# Patient Record
Sex: Male | Born: 1960 | Race: White | Hispanic: No | Marital: Married | Smoking: Never smoker
Health system: Southern US, Community
[De-identification: ages and names within clinical notes are randomized; demographics above are authoritative.]

## PROBLEM LIST (undated history)

## (undated) DIAGNOSIS — Z91018 Allergy to other foods: Secondary | ICD-10-CM

## (undated) DIAGNOSIS — E785 Hyperlipidemia, unspecified: Secondary | ICD-10-CM

## (undated) DIAGNOSIS — I7090 Unspecified atherosclerosis: Secondary | ICD-10-CM

## (undated) DIAGNOSIS — E079 Disorder of thyroid, unspecified: Secondary | ICD-10-CM

## (undated) DIAGNOSIS — E669 Obesity, unspecified: Secondary | ICD-10-CM

## (undated) DIAGNOSIS — I708 Atherosclerosis of other arteries: Secondary | ICD-10-CM

## (undated) HISTORY — DX: Obesity, unspecified: E66.9

## (undated) HISTORY — PX: HERNIA REPAIR: SHX51

## (undated) HISTORY — DX: Hyperlipidemia, unspecified: E78.5

## (undated) HISTORY — PX: CHOLECYSTECTOMY, LAPAROSCOPIC: SHX56

## (undated) HISTORY — DX: Disorder of thyroid, unspecified: E07.9

## (undated) HISTORY — DX: Atherosclerosis of other arteries: I70.8

## (undated) HISTORY — DX: Allergy to other foods: Z91.018

## (undated) HISTORY — PX: CORNEAL TRANSPLANT: SHX108

## (undated) HISTORY — DX: Unspecified atherosclerosis: I70.90

---

## 2009-10-24 ENCOUNTER — Emergency Department (HOSPITAL_COMMUNITY): Admission: EM | Admit: 2009-10-24 | Discharge: 2009-10-24 | Payer: Self-pay | Admitting: Family Medicine

## 2011-03-16 ENCOUNTER — Inpatient Hospital Stay (INDEPENDENT_AMBULATORY_CARE_PROVIDER_SITE_OTHER)
Admission: RE | Admit: 2011-03-16 | Discharge: 2011-03-16 | Disposition: A | Discharge status: Home / Self Care | Payer: 59 | Source: Ambulatory Visit | Attending: Emergency Medicine | Admitting: Emergency Medicine

## 2011-03-16 ENCOUNTER — Emergency Department (HOSPITAL_COMMUNITY)
Admission: EM | Admit: 2011-03-16 | Discharge: 2011-03-16 | Discharge status: Against Medical Advice | Payer: 59 | Attending: Emergency Medicine | Admitting: Emergency Medicine

## 2011-03-16 DIAGNOSIS — R42 Dizziness and giddiness: Secondary | ICD-10-CM

## 2011-03-16 DIAGNOSIS — Z0389 Encounter for observation for other suspected diseases and conditions ruled out: Secondary | ICD-10-CM | POA: Insufficient documentation

## 2011-03-16 DIAGNOSIS — R002 Palpitations: Secondary | ICD-10-CM

## 2011-08-31 LAB — HM COLONOSCOPY

## 2013-03-11 ENCOUNTER — Other Ambulatory Visit: Payer: Self-pay | Admitting: Physician Assistant

## 2013-03-12 NOTE — Telephone Encounter (Signed)
Pt has not been seen since 2012.  But has appt in July.  One month refill of thyroid med sent to pharmacy

## 2013-03-24 ENCOUNTER — Other Ambulatory Visit: Payer: Managed Care, Other (non HMO)

## 2013-03-24 ENCOUNTER — Other Ambulatory Visit: Payer: Self-pay | Admitting: Family Medicine

## 2013-03-24 ENCOUNTER — Encounter: Payer: Self-pay | Admitting: Family Medicine

## 2013-03-24 ENCOUNTER — Ambulatory Visit (INDEPENDENT_AMBULATORY_CARE_PROVIDER_SITE_OTHER): Payer: Managed Care, Other (non HMO) | Admitting: Family Medicine

## 2013-03-24 VITALS — BP 100/66 | HR 80 | Temp 98.3°F | Resp 18 | Ht 70.5 in | Wt 268.0 lb

## 2013-03-24 DIAGNOSIS — E785 Hyperlipidemia, unspecified: Secondary | ICD-10-CM

## 2013-03-24 DIAGNOSIS — E039 Hypothyroidism, unspecified: Secondary | ICD-10-CM

## 2013-03-24 DIAGNOSIS — Z79899 Other long term (current) drug therapy: Secondary | ICD-10-CM

## 2013-03-24 DIAGNOSIS — E669 Obesity, unspecified: Secondary | ICD-10-CM

## 2013-03-24 DIAGNOSIS — Z125 Encounter for screening for malignant neoplasm of prostate: Secondary | ICD-10-CM

## 2013-03-24 LAB — COMPREHENSIVE METABOLIC PANEL
ALT: 28 U/L (ref 0–53)
AST: 24 U/L (ref 0–37)
CO2: 26 mEq/L (ref 19–32)
Calcium: 9.3 mg/dL (ref 8.4–10.5)
Chloride: 104 mEq/L (ref 96–112)
Creat: 1.01 mg/dL (ref 0.50–1.35)
Potassium: 4.7 mEq/L (ref 3.5–5.3)
Sodium: 139 mEq/L (ref 135–145)
Total Protein: 6.9 g/dL (ref 6.0–8.3)

## 2013-03-24 LAB — LIPID PANEL
Cholesterol: 178 mg/dL (ref 0–200)
Total CHOL/HDL Ratio: 4.2 Ratio

## 2013-03-24 LAB — PSA: PSA: 0.5 ng/mL (ref <=4.00)

## 2013-03-24 LAB — TSH: TSH: 0.045 u[IU]/mL — ABNORMAL LOW (ref 0.350–4.500)

## 2013-03-24 MED ORDER — ORLISTAT 120 MG PO CAPS: 120.0000 mg | ORAL_CAPSULE | Freq: Three times a day (TID) | ORAL | Status: DC

## 2013-03-24 NOTE — Progress Notes (Signed)
  Subjective:    Patient ID: Juan Stone, male    DOB: Dec 03, 1960, 52 y.o.   MRN: [id number]  HPI Patient has hypothyroidism. He is currently taking levothyroxine 200 mcg by mouth daily. He denies any depression, weight changes, alopecia, palpitations, or tremor.  He also has hyperlipidemia. Currently taking simvastatin 20 mg by mouth daily. He denies any myalgias or right upper quadrant pain.  He also has obesity.  He is interested in weight loss and medical options for weight loss.  His colonoscopy was in 2012 and showed only a tubular adenoma. It is not due again until 2017. He is overdue for prostate exam.  Past Medical History  Diagnosis Date  . Hyperlipidemia   . Thyroid disease     hypothyroidism  . Obesity    Current Outpatient Prescriptions on File Prior to Visit  Medication Sig Dispense Refill  . levothyroxine (SYNTHROID, LEVOTHROID) 200 MCG tablet TAKE 1 TABLET BY MOUTH EVERY DAY  30 tablet  0   No current facility-administered medications on file prior to visit.   No Known Allergies History   Social History  . Marital Status: Married    Spouse Name: N/A    Number of Children: N/A  . Years of Education: N/A   Occupational History  . Not on file.   Social History Main Topics  . Smoking status: Never Smoker   . Smokeless tobacco: Not on file  . Alcohol Use: No     Comment: Ocassional  . Drug Use: No  . Sexually Active: Not on file   Other Topics Concern  . Not on file   Social History Narrative  . No narrative on file   Family History  Problem Relation Age of Onset  . COPD Mother   . Hyperlipidemia Father   . Heart disease Father       Review of Systems  All other systems reviewed and are negative.       Objective:   Physical Exam  Vitals reviewed. Constitutional: He appears well-developed and well-nourished.  HENT:  Mouth/Throat: Oropharynx is clear and moist.  Neck: Neck supple. No JVD present. No thyromegaly present.   Cardiovascular: Normal rate, regular rhythm and normal heart sounds.  Exam reveals no gallop and no friction rub.   No murmur heard. Pulmonary/Chest: Effort normal and breath sounds normal. No respiratory distress. He has no wheezes. He has no rales. He exhibits no tenderness.  Abdominal: Soft. Bowel sounds are normal. He exhibits no distension. There is no tenderness. There is no rebound and no guarding.  Genitourinary: Rectum normal and prostate normal.  Lymphadenopathy:    He has no cervical adenopathy.  Skin: Skin is warm and dry. No rash noted. No erythema. No pallor.          Assessment & Plan:  1. Unspecified hypothyroidism Check the patient's TSH and titrate his levothyroxine to achieve a TSH within goal.  2. Obesity, unspecified We discussed therapeutic lifestyle changes including a 1500-calorie a day diet, 30 minutes of aerobic exercise 3-4 days a week, and medications for weight loss. The patient elects to try orlistat 120 mg by mouth 3 times a day  3. HLD (hyperlipidemia) Check fasting lipid panel. Goal LDL is less than 130  4. Prostate cancer screening Prostate exam is normal I will check a PSA.

## 2013-03-27 ENCOUNTER — Encounter: Payer: Self-pay | Admitting: Family Medicine

## 2013-03-27 ENCOUNTER — Other Ambulatory Visit: Payer: Self-pay | Admitting: Family Medicine

## 2013-03-27 ENCOUNTER — Telehealth: Payer: Self-pay | Admitting: Family Medicine

## 2013-03-27 DIAGNOSIS — E039 Hypothyroidism, unspecified: Secondary | ICD-10-CM

## 2013-03-27 MED ORDER — LEVOTHYROXINE SODIUM 88 MCG PO TABS: 88.0000 ug | ORAL_TABLET | Freq: Every day | ORAL | Status: DC

## 2013-03-27 MED ORDER — LEVOTHYROXINE SODIUM 100 MCG PO TABS: 100.0000 ug | ORAL_TABLET | Freq: Every day | ORAL | Status: DC

## 2013-03-27 NOTE — Telephone Encounter (Signed)
Med filled.  

## 2013-03-27 NOTE — Progress Notes (Signed)
Tried to call pt x 3 phone keeps hanging up on 03/25/13 and again today..will send letter to pt to contact office about labs

## 2013-03-27 NOTE — Telephone Encounter (Signed)
He can try 175 but we need to recheck tsh in 6 weeks.

## 2013-03-28 MED ORDER — LEVOTHYROXINE SODIUM 175 MCG PO TABS: 175.0000 ug | ORAL_TABLET | Freq: Every day | ORAL | Status: DC

## 2013-03-28 MED ORDER — SIMVASTATIN 20 MG PO TABS: 20.0000 mg | ORAL_TABLET | Freq: Every day | ORAL | Status: DC

## 2013-03-28 NOTE — Telephone Encounter (Signed)
thryoid med sent to [facility] and simvastatin sent to [facility] per pt request

## 2013-05-21 ENCOUNTER — Telehealth: Payer: Self-pay | Admitting: Family Medicine

## 2013-05-21 MED ORDER — FLUTICASONE PROPIONATE 50 MCG/ACT NA SUSP: 2.0000 | Freq: Every day | NASAL | Status: DC

## 2013-05-21 NOTE — Telephone Encounter (Signed)
Rx Refilled  

## 2013-05-21 NOTE — Telephone Encounter (Signed)
Fluticasone Prop 50 mcg spray inhale 2 sprays in each nostril every day #48

## 2013-05-22 ENCOUNTER — Other Ambulatory Visit: Payer: Managed Care, Other (non HMO)

## 2013-05-22 DIAGNOSIS — E039 Hypothyroidism, unspecified: Secondary | ICD-10-CM

## 2013-05-22 LAB — TSH: TSH: 0.089 u[IU]/mL — ABNORMAL LOW (ref 0.350–4.500)

## 2013-05-28 ENCOUNTER — Other Ambulatory Visit: Payer: Self-pay | Admitting: Family Medicine

## 2013-05-28 MED ORDER — LEVOTHYROXINE SODIUM 125 MCG PO TABS: 125.0000 ug | ORAL_TABLET | Freq: Every day | ORAL | Status: DC

## 2013-07-24 ENCOUNTER — Other Ambulatory Visit: Payer: Self-pay

## 2013-07-25 ENCOUNTER — Other Ambulatory Visit: Payer: Managed Care, Other (non HMO)

## 2013-07-25 DIAGNOSIS — E039 Hypothyroidism, unspecified: Secondary | ICD-10-CM

## 2013-07-25 DIAGNOSIS — Z79899 Other long term (current) drug therapy: Secondary | ICD-10-CM

## 2013-07-31 ENCOUNTER — Telehealth: Payer: Self-pay | Admitting: *Deleted

## 2013-07-31 NOTE — Telephone Encounter (Signed)
Wants referral to see endocrinologist d/t his TSH level fluctuating

## 2013-08-05 ENCOUNTER — Other Ambulatory Visit: Payer: Self-pay | Admitting: Family Medicine

## 2013-08-05 DIAGNOSIS — E039 Hypothyroidism, unspecified: Secondary | ICD-10-CM

## 2013-08-05 NOTE — Telephone Encounter (Signed)
Ok with endo referral, but I would still continue the plan for now.

## 2013-08-06 NOTE — Telephone Encounter (Signed)
..  Patient aware per vm 

## 2013-08-15 ENCOUNTER — Telehealth: Payer: Self-pay | Admitting: Family Medicine

## 2013-08-15 DIAGNOSIS — E039 Hypothyroidism, unspecified: Secondary | ICD-10-CM

## 2013-08-15 MED ORDER — LEVOTHYROXINE SODIUM 150 MCG PO TABS: 150.0000 ug | ORAL_TABLET | Freq: Every day | ORAL | Status: DC

## 2013-08-15 NOTE — Telephone Encounter (Signed)
Left mess for patient to call back.  New RX sent to pharmacy.  Labs ordered

## 2013-08-15 NOTE — Telephone Encounter (Signed)
Message copied by Donne Anon on Fri Aug 15, 2013 12:06 PM ------      Message from: Lynnea Ferrier      Created: Mon Jul 28, 2013  7:20 AM       Increase levothyroxine to 150 mcg poqday and recheck tsh in 2 months. ------

## 2013-08-15 NOTE — Telephone Encounter (Signed)
Spoke to patient about Thyroid blood work and need to change medication.  He said that he had already spoken to someone a few weeks ago and had requested referral to endocrinologist.  On review of Ref Q appt pending with Endocrine.  Gave patient phone # to endocrine office

## 2013-08-15 NOTE — Telephone Encounter (Signed)
Message copied by Donne Anon on Fri Aug 15, 2013  3:23 PM ------      Message from: Lynnea Ferrier      Created: Mon Jul 28, 2013  7:20 AM       Increase levothyroxine to 150 mcg poqday and recheck tsh in 2 months. ------

## 2013-08-26 ENCOUNTER — Ambulatory Visit: Payer: 59 | Admitting: Endocrinology

## 2013-08-28 ENCOUNTER — Encounter: Payer: Self-pay | Admitting: Endocrinology

## 2013-08-28 ENCOUNTER — Ambulatory Visit (INDEPENDENT_AMBULATORY_CARE_PROVIDER_SITE_OTHER): Payer: Managed Care, Other (non HMO) | Admitting: Endocrinology

## 2013-08-28 VITALS — BP 126/80 | HR 68 | Temp 98.1°F | Ht 70.5 in | Wt 274.0 lb

## 2013-08-28 DIAGNOSIS — E039 Hypothyroidism, unspecified: Secondary | ICD-10-CM | POA: Insufficient documentation

## 2013-08-28 NOTE — Patient Instructions (Addendum)
Please repeat the thyroid blood test in 1 month.  We'll contact you with results.       Levothyroxine oral capsules What is this medicine? LEVOTHYROXINE (lee voe thye ROX een) is a thyroid hormone. This medicine can improve symptoms of thyroid deficiency such as slow speech, lack of energy, weight gain, hair loss, dry skin, and feeling cold. It also helps to treat goiter (an enlarged thyroid gland). It is also used to treat some kinds of thyroid cancer along with surgery and other medicines. This medicine may be used for other purposes; ask your health care provider or pharmacist if you have questions. COMMON BRAND NAME(S): Tirosint What should I tell my health care provider before I take this medicine? They need to know if you have any of these conditions: -angina -blood clotting problems -diabetes -dieting or on a weight loss program -fertility problems -heart disease -high levels of thyroid hormone -pituitary gland problem -previous heart attack -an unusual or allergic reaction to levothyroxine, thyroid hormones, other medicines, foods, dyes, or preservatives -pregnant or trying to get pregnant -breast-feeding How should I use this medicine? Take this medicine by mouth with a glass of water. It is best to take on an empty stomach, at least 30 minutes to one hour before breakfast. Avoid taking antacids containing aluminum or magnesium, simethicone, bile acid sequestrants, calcium carbonate, sodium polystyrene sulfonate, ferrous sulfate, and sucralfate within 4 hours of taking this medicine. Do not cut, crush or chew this medicine. Follow the directions on the prescription label. Take at the same time each day. Do not take your medicine more often than directed. Talk to your pediatrician regarding the use of this medicine in children. While this drug may be prescribed for selected conditions, precautions do apply. Since the capsules cannot be crushed or placed in water, they may only be  given to infants and children who are able to swallow an intact capsule. Overdosage: If you think you've taken too much of this medicine contact a poison control center or emergency room at once. Overdosage: If you think you have taken too much of this medicine contact a poison control center or emergency room at once. NOTE: This medicine is only for you. Do not share this medicine with others. What if I miss a dose? If you miss a dose, take it as soon as you can. If it is almost time for your next dose, take only that dose. Do not take double or extra doses. What may interact with this medicine? -amiodarone -antacids -anti-thyroid medicines -calcium supplements -carbamazepine -cholestyramine -colestipol -digoxin -male hormones, including contraceptive or birth control pills -iron supplements -ketamine -liquid nutrition products like Ensure -medicines for colds and breathing difficulties -medicines for diabetes -medicines for mental depression -medicines or herbals used to decrease weight or appetite -phenobarbital or other barbiturate medications -phenytoin -prednisone or other corticosteroids -rifabutin -rifampin -simethicone -sodium polystyrene sulfonate -soy isoflavones -sucralfate -theophylline -warfarin This list may not describe all possible interactions. Give your health care provider a list of all the medicines, herbs, non-prescription drugs, or dietary supplements you use. Also tell them if you smoke, drink alcohol, or use illegal drugs. Some items may interact with your medicine. What should I watch for while using this medicine? Do not switch brands of this medicine unless your health care professional agrees with the change. Ask questions if you are uncertain. You will need regular exams and occasional blood tests to check the response to treatment. If you are receiving this medicine for an underactive  thyroid, it may be several weeks before you notice an  improvement. Check with your doctor or health care professional if your symptoms do not improve. It may be necessary for you to take this medicine for the rest of your life. Do not stop using this medicine unless your doctor or health care professional advises you to. This medicine can affect blood sugar levels. If you have diabetes, check your blood sugar as directed. You may lose some of your hair when you first start treatment. With time, this usually corrects itself. If you are going to have surgery, tell your doctor or health care professional that you are taking this medicine. What side effects may I notice from receiving this medicine? Side effects that you should report to your doctor or health care professional as soon as possible: -allergic reactions like skin rash, itching or hives, swelling of the face, lips, or tongue -chest pain -excessive sweating or intolerance to heat -fast or irregular heartbeat -nervousness -swelling of ankles, feet, or legs -tremors Side effects that usually do not require medical attention (Report these to your doctor or health care professional if they continue or are bothersome.): -changes in appetite -changes in menstrual periods -diarrhea -hair loss -headache -trouble sleeping -weight loss This list may not describe all possible side effects. Call your doctor for medical advice about side effects. You may report side effects to FDA at 1-800-FDA-1088. Where should I keep my medicine? Keep out of the reach of children. Store at room temperature between 15 and 30 degrees C (59 and 86 degrees F). Protect from light and moisture. Keep container tightly closed. Throw away any unused medicine after the expiration date. NOTE: This sheet is a summary. It may not cover all possible information. If you have questions about this medicine, talk to your doctor, pharmacist, or health care provider.  2014, Elsevier/Gold Standard. (2008-11-26 15:25:58)

## 2013-08-28 NOTE — Progress Notes (Signed)
Subjective:    Patient ID: Juan Stone, male    DOB: July 21, 1961, 52 y.o.   MRN: [id number]  HPI Pt was dx'ed with chronic hypothyroidism, in 1999.  He has been on synthroid since then.  In 2014, synthroid dosage need has varied from 125 to 200 mcg/day.  He has minimal if any tremor of the hands, or assoc anxiety.  He says he does not any products such as metamucil, which cold interfere with the absorption of the synthroid.  He has been on the current 150 mcg/day, x approx 10 days.   Past Medical History  Diagnosis Date  . Hyperlipidemia   . Thyroid disease     hypothyroidism  . Obesity     No past surgical history on file.  History   Social History  . Marital Status: Married    Spouse Name: N/A    Number of Children: N/A  . Years of Education: N/A   Occupational History  . Not on file.   Social History Main Topics  . Smoking status: Never Smoker   . Smokeless tobacco: Not on file  . Alcohol Use: No     Comment: Ocassional  . Drug Use: No  . Sexual Activity: Not on file   Other Topics Concern  . Not on file   Social History Narrative  . No narrative on file    Current Outpatient Prescriptions on File Prior to Visit  Medication Sig Dispense Refill  . fluticasone (FLONASE) 50 MCG/ACT nasal spray Place 2 sprays into the nose daily.  16 g  5  . levothyroxine (SYNTHROID, LEVOTHROID) 150 MCG tablet Take 1 tablet (150 mcg total) by mouth daily.  90 tablet  0  . orlistat (XENICAL) 120 MG capsule Take 1 capsule (120 mg total) by mouth 3 (three) times daily with meals.  90 capsule  3  . simvastatin (ZOCOR) 20 MG tablet Take 1 tablet (20 mg total) by mouth at bedtime.  90 tablet  1   No current facility-administered medications on file prior to visit.    No Known Allergies  Family History  Problem Relation Age of Onset  . COPD Mother   . Hyperlipidemia Father   . Heart disease Father   mother had hypothyroidism  BP 126/80  Pulse 68  Temp(Src) 98.1 F (36.7 C)  (Oral)  Ht 5' 10.5" (1.791 m)  Wt 274 lb (124.286 kg)  BMI 38.75 kg/m2  SpO2 98%  Review of Systems denies depression, sob, memory loss, constipation, numbness, dry skin, rhinorrhea, diarrhea, brbpr, easy bruising, and syncope.  He has intermittent muscle cramps, fatigue, arthralgias, and weight gain.  He attributes blurry vision to fuch's dz.    Objective:   Physical Exam VS: see vs page GEN: no distress HEAD: head: no deformity eyes: no periorbital swelling, no proptosis external nose and ears are normal mouth: no lesion seen NECK: supple, thyroid is not enlarged CHEST WALL: no deformity LUNGS: clear to auscultation BREASTS:  No gynecomastia CV: reg rate and rhythm, no murmur ABD: abdomen is soft, nontender.  no hepatosplenomegaly.  not distended.  no hernia MUSCULOSKELETAL: muscle bulk and strength are grossly normal.  no obvious joint swelling.  gait is normal and steady EXTEMITIES: no deformity. No leg ulcer.  1+ bilat leg edema PULSES: dorsalis pedis intact bilat.  no carotid bruit NEURO:  cn 2-12 grossly intact.   readily moves all 4's.  sensation is intact to touch on all 4's SKIN:  Normal texture and temperature.  No rash or suspicious lesion is visible.   NODES:  None palpable at the neck PSYCH: alert, well-oriented.  Does not appear anxious nor depressed.  Lab Results  Component Value Date   TSH 14.789* 07/25/2013      Assessment & Plan:  Chronic primary hypothyroidism: the reason for his fluctuating requirement is unclear.  We discussed how to take synthroid, but he seems to already being taking it correctly.   Weight gain: unlikely thyroid-related. Muscle cramps: not thyroid-related

## 2013-09-26 ENCOUNTER — Other Ambulatory Visit: Payer: Self-pay | Admitting: Endocrinology

## 2013-09-26 ENCOUNTER — Telehealth: Payer: Self-pay

## 2013-09-26 DIAGNOSIS — E039 Hypothyroidism, unspecified: Secondary | ICD-10-CM

## 2013-09-26 NOTE — Telephone Encounter (Signed)
TSH labs for [facility].

## 2013-09-27 LAB — TSH: TSH: 2.418 u[IU]/mL (ref 0.350–4.500)

## 2013-10-03 ENCOUNTER — Other Ambulatory Visit: Payer: 59

## 2013-10-10 DIAGNOSIS — H18519 Endothelial corneal dystrophy, unspecified eye: Secondary | ICD-10-CM | POA: Insufficient documentation

## 2013-10-10 DIAGNOSIS — H2510 Age-related nuclear cataract, unspecified eye: Secondary | ICD-10-CM | POA: Insufficient documentation

## 2013-10-27 ENCOUNTER — Telehealth: Payer: Self-pay | Admitting: Family Medicine

## 2013-10-27 MED ORDER — SIMVASTATIN 20 MG PO TABS: 20.0000 mg | ORAL_TABLET | Freq: Every day | ORAL | Status: DC

## 2013-10-27 MED ORDER — LEVOTHYROXINE SODIUM 150 MCG PO TABS: 150.0000 ug | ORAL_TABLET | Freq: Every day | ORAL | Status: DC

## 2013-10-27 NOTE — Telephone Encounter (Signed)
Call back number is [phone] Pharmacy [facility]  PT is needing a refill on his  simvastatin (ZOCOR) 20 MG tablet levothyroxine (SYNTHROID, LEVOTHROID) 150 MCG tablet  Both needs to be filled for 90 days

## 2013-10-27 NOTE — Telephone Encounter (Signed)
Rx Refilled  

## 2014-01-16 DIAGNOSIS — Z947 Corneal transplant status: Secondary | ICD-10-CM | POA: Insufficient documentation

## 2014-01-19 DIAGNOSIS — Z961 Presence of intraocular lens: Secondary | ICD-10-CM | POA: Insufficient documentation

## 2014-01-21 ENCOUNTER — Other Ambulatory Visit: Payer: Self-pay | Admitting: Family Medicine

## 2014-01-21 NOTE — Telephone Encounter (Signed)
Refill appropriate and filled per protocol. 

## 2014-01-30 ENCOUNTER — Telehealth: Payer: Self-pay | Admitting: Family Medicine

## 2014-01-30 NOTE — Telephone Encounter (Signed)
PATIENT IS  CALLING TO SAY IT LOOKS LIKE THERE IS A PROBLEM FROM OUR SIDE WITH HIS Simvastatin (Tab) PLEASE CALL HIM AT [phone]

## 2014-01-30 NOTE — Telephone Encounter (Signed)
[facility] to schedule appt for ov and labs - will refill medication if appt made

## 2014-02-02 ENCOUNTER — Other Ambulatory Visit: Payer: Managed Care, Other (non HMO)

## 2014-02-02 DIAGNOSIS — E785 Hyperlipidemia, unspecified: Secondary | ICD-10-CM

## 2014-02-02 DIAGNOSIS — E039 Hypothyroidism, unspecified: Secondary | ICD-10-CM

## 2014-02-02 DIAGNOSIS — Z79899 Other long term (current) drug therapy: Secondary | ICD-10-CM

## 2014-02-02 LAB — COMPREHENSIVE METABOLIC PANEL
ALT: 26 U/L (ref 0–53)
AST: 22 U/L (ref 0–37)
Albumin: 4.3 g/dL (ref 3.5–5.2)
Alkaline Phosphatase: 57 U/L (ref 39–117)
BUN: 14 mg/dL (ref 6–23)
CO2: 27 mEq/L (ref 19–32)
Calcium: 9.5 mg/dL (ref 8.4–10.5)
Chloride: 103 mEq/L (ref 96–112)
Creat: 1 mg/dL (ref 0.50–1.35)
Glucose, Bld: 95 mg/dL (ref 70–99)
Potassium: 5.1 mEq/L (ref 3.5–5.3)
Sodium: 138 mEq/L (ref 135–145)
Total Bilirubin: 0.5 mg/dL (ref 0.2–1.2)
Total Protein: 6.7 g/dL (ref 6.0–8.3)

## 2014-02-02 LAB — LIPID PANEL
Cholesterol: 164 mg/dL (ref 0–200)
HDL: 43 mg/dL (ref >39)
LDL CALC: 92 mg/dL (ref 0–99)
TRIGLYCERIDES: 145 mg/dL (ref <150)
Total CHOL/HDL Ratio: 3.8 Ratio
VLDL: 29 mg/dL (ref 0–40)

## 2014-02-02 LAB — CBC WITH DIFFERENTIAL/PLATELET
Basophils Absolute: 0.1 10*3/uL (ref 0.0–0.1)
Basophils Relative: 1 % (ref 0–1)
Eosinophils Absolute: 0.1 10*3/uL (ref 0.0–0.7)
Eosinophils Relative: 2 % (ref 0–5)
HCT: 41.6 % (ref 39.0–52.0)
Hemoglobin: 14.3 g/dL (ref 13.0–17.0)
Lymphocytes Relative: 28 % (ref 12–46)
Lymphs Abs: 1.5 10*3/uL (ref 0.7–4.0)
MCH: 30.2 pg (ref 26.0–34.0)
MCHC: 34.4 g/dL (ref 30.0–36.0)
MCV: 87.9 fL (ref 78.0–100.0)
Monocytes Absolute: 0.5 10*3/uL (ref 0.1–1.0)
Monocytes Relative: 10 % (ref 3–12)
Neutro Abs: 3.1 10*3/uL (ref 1.7–7.7)
Neutrophils Relative %: 59 % (ref 43–77)
Platelets: 249 10*3/uL (ref 150–400)
RBC: 4.73 MIL/uL (ref 4.22–5.81)
RDW: 13.2 % (ref 11.5–15.5)
WBC: 5.2 10*3/uL (ref 4.0–10.5)

## 2014-02-02 LAB — TSH: TSH: 2.546 u[IU]/mL (ref 0.350–4.500)

## 2014-02-02 MED ORDER — SIMVASTATIN 20 MG PO TABS: 20.0000 mg | ORAL_TABLET | Freq: Every day | ORAL | Status: DC

## 2014-02-03 NOTE — Telephone Encounter (Signed)
Appt has been made [facility]

## 2014-02-06 ENCOUNTER — Ambulatory Visit (INDEPENDENT_AMBULATORY_CARE_PROVIDER_SITE_OTHER): Payer: Managed Care, Other (non HMO) | Admitting: Family Medicine

## 2014-02-06 ENCOUNTER — Encounter: Payer: Self-pay | Admitting: Family Medicine

## 2014-02-06 VITALS — BP 110/80 | HR 72 | Temp 97.0°F | Resp 18 | Ht 70.5 in | Wt 270.0 lb

## 2014-02-06 DIAGNOSIS — E785 Hyperlipidemia, unspecified: Secondary | ICD-10-CM

## 2014-02-06 DIAGNOSIS — E039 Hypothyroidism, unspecified: Secondary | ICD-10-CM

## 2014-02-06 MED ORDER — SIMVASTATIN 20 MG PO TABS: 20.0000 mg | ORAL_TABLET | Freq: Every day | ORAL | Status: DC

## 2014-02-06 MED ORDER — LEVOTHYROXINE SODIUM 150 MCG PO TABS: ORAL_TABLET | ORAL | Status: DC

## 2014-02-06 MED ORDER — FLUTICASONE PROPIONATE 50 MCG/ACT NA SUSP: 2.0000 | Freq: Every day | NASAL | Status: DC

## 2014-02-06 NOTE — Progress Notes (Signed)
   Subjective:    Patient ID: Juan Stone, male    DOB: 07/15/1961, 53 y.o.   MRN: [id number]  HPI  Patient has hyperlipidemia and hypothyroidism. He is currently on levothyroxin 150 mcg by mouth daily and simvastatin 40 mg by mouth daily. He denies any myalgias or right upper quadrant pain. He does still complain of fatigue although his TSH is within therapeutic limits. He is scheduled to be evaluated for obstructive sleep apnea. He is never had his testosterone level checked. He is not engaging in any regular aerobic exercise nor is he following a low calorie diet. Past Medical History  Diagnosis Date  . Hyperlipidemia   . Thyroid disease     hypothyroidism  . Obesity    No current outpatient prescriptions on file prior to visit.   No current facility-administered medications on file prior to visit.   No Known Allergies History   Social History  . Marital Status: Married    Spouse Name: N/A    Number of Children: N/A  . Years of Education: N/A   Occupational History  . Not on file.   Social History Main Topics  . Smoking status: Never Smoker   . Smokeless tobacco: Not on file  . Alcohol Use: No     Comment: Ocassional  . Drug Use: No  . Sexual Activity: Not on file   Other Topics Concern  . Not on file   Social History Narrative  . No narrative on file      Review of Systems  All other systems reviewed and are negative.      Objective:   Physical Exam  Vitals reviewed. Neck: No thyromegaly present.  Cardiovascular: Normal rate, regular rhythm and normal heart sounds.   Pulmonary/Chest: Effort normal and breath sounds normal. No respiratory distress. He has no wheezes. He has no rales.  Abdominal: Soft. Bowel sounds are normal. He exhibits no distension. There is no tenderness. There is no rebound.  Musculoskeletal: He exhibits no edema.  Lymphadenopathy:    He has no cervical adenopathy.          Assessment & Plan:  1. Unspecified  hypothyroidism TSH is within therapeutic limits. I'll make no changes to his levothyroxine.  2. Other and unspecified hyperlipidemia Continue simvastatin 20 mg by mouth daily.  I encouraged the patient to engage in 30 minutes of aerobic exercise 5 days a week and to consume 1500 calories per day total in order to achieve weight loss. I would like to have the patient weigh less than 200 lbs.

## 2014-02-20 ENCOUNTER — Encounter: Payer: Self-pay | Admitting: Family Medicine

## 2014-03-03 ENCOUNTER — Encounter: Payer: Self-pay | Admitting: Family Medicine

## 2014-04-06 ENCOUNTER — Ambulatory Visit (INDEPENDENT_AMBULATORY_CARE_PROVIDER_SITE_OTHER): Payer: Managed Care, Other (non HMO) | Admitting: Physician Assistant

## 2014-04-06 ENCOUNTER — Encounter: Payer: Self-pay | Admitting: Physician Assistant

## 2014-04-06 VITALS — BP 126/76 | HR 60 | Temp 98.9°F | Resp 18 | Wt 279.0 lb

## 2014-04-06 DIAGNOSIS — G5790 Unspecified mononeuropathy of unspecified lower limb: Secondary | ICD-10-CM

## 2014-04-06 DIAGNOSIS — R209 Unspecified disturbances of skin sensation: Secondary | ICD-10-CM

## 2014-04-06 DIAGNOSIS — M792 Neuralgia and neuritis, unspecified: Secondary | ICD-10-CM

## 2014-04-06 DIAGNOSIS — R202 Paresthesia of skin: Secondary | ICD-10-CM

## 2014-04-07 NOTE — Progress Notes (Signed)
Patient ID: Juan HartiganRobert Stone MRN: [id number], DOB: [birth year], 53 y.o. Date of Encounter: @DATE @  Chief Complaint:  Chief Complaint  Patient presents with  . c/o numb feeling in left thigh x 3 mths    now becoming painful cramping pain    HPI: 53 y.o. year old white male  presents with above complaint.   He says he had eye surgery in April. He had to lie on his back for 12-14 hours after the surgery. Says that since then he has been having numb tingly feeling in the anterior aspect of his left thigh. Says 2 weeks ago he had to do a lot of walking. He developed pain in the anterior thigh. Says it is a burning pain. He says that he stretched and that gave him relief for about one hour. Says when that area tightens up/cramps,  it as an 8/10 on the pain scale. Says this happens occasionally. Says the numb tingly sensation is there all the time.  He has noticed no discomfort in his low back more in the buttock area.  Says the numb /  tingly feeling is " like when your arm falls asleep" -- that same type of sensation.  Reports that he has had no trauma or injury and has done no activity to cause this. 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 The rest of his time he is at a desk and computer.   Past Medical History  Diagnosis Date  . Hyperlipidemia   . Thyroid disease     hypothyroidism  . Obesity      Home Meds: Outpatient Prescriptions Prior to Visit  Medication Sig Dispense Refill  . levothyroxine (SYNTHROID, LEVOTHROID) 150 MCG tablet TAKE 1 TABLET DAILY  90 tablet  3  . simvastatin (ZOCOR) 20 MG tablet Take 1 tablet (20 mg total) by mouth at bedtime.  90 tablet  3  . fluticasone (FLONASE) 50 MCG/ACT nasal spray Place 2 sprays into both nostrils daily.  16 g  11   No facility-administered medications prior to visit.    Allergies: No Known Allergies  History   Social History  . Marital Status: Married    Spouse  Name: N/A    Number of Children: N/A  . Years of Education: N/A   Occupational History  . Not on file.   Social History Main Topics  . Smoking status: Never Smoker   . Smokeless tobacco: Never Used  . Alcohol Use: No     Comment: Ocassional  . Drug Use: No  . Sexual Activity: Not on file   Other Topics Concern  . Not on file   Social History Narrative  . No narrative on file    Family History  Problem Relation Age of Onset  . COPD Mother   . Hyperlipidemia Father   . Heart disease Father      Review of Systems:  See HPI for pertinent ROS. All other ROS negative.    Physical Exam: Blood pressure 126/76, pulse 60, temperature 98.9 F (37.2 C), temperature source Oral, resp. rate 18, weight 279 lb (126.554 kg)., Body mass index is 39.45 kg/(m^2). General: WM. Moderate Obesity. Appears in no acute distress. Neck: Supple. No thyromegaly. No lymphadenopathy. Lungs: Clear bilaterally to auscultation without wheezes, rales, or rhonchi. Breathing is unlabored. Heart: RRR with S1 S2. No murmurs, rubs, or gallops. Musculoskeletal:  Strength and tone normal for age. Back; tenderness  with per palpation of the low back. No tenderness with palpation of the sciatic notches bilaterally. Bilateral straight leg raise and hip adduction are normal. Sensory evaluation: Light touch of bilateral anterior thighs: He says he can feel the touch on the left anterior thigh but says it is a little bit duller than what he can feel on the right anterior thigh. Patella reflexes 2+ and equal bilaterally. Extremities/Skin: Warm and dry. No rashes. Neuro: Alert and oriented X 3. Moves all extremities spontaneously. Gait is normal. CNII-XII grossly in tact. Psych:  Responds to questions appropriately with a normal affect.     ASSESSMENT AND PLAN:  53 y.o. year old male with  1. Left leg paresthesias - DG Lumbar Spine Complete; Future - Motor nerve conduction test w/ f-wave study; Future  2.  Neuropathic pain of thigh, left - DG Lumbar Spine Complete; Future - Motor nerve conduction test w/ f-wave study; Future  Will obtain lumbar spine x-ray. If this shows no significant abnormality to explain the symptoms then will get nerve conduction studies. Will follow up with patient after we get these results.  Signed, [address] [place], [place], [facility] 04/07/2014 8:05 AM

## 2014-04-08 ENCOUNTER — Telehealth: Payer: Self-pay | Admitting: Family Medicine

## 2014-04-08 ENCOUNTER — Ambulatory Visit
Admission: RE | Admit: 2014-04-08 | Discharge: 2014-04-08 | Disposition: A | Discharge status: Home / Self Care | Payer: Managed Care, Other (non HMO) | Source: Ambulatory Visit | Attending: Physician Assistant | Admitting: Physician Assistant

## 2014-04-08 DIAGNOSIS — G5790 Unspecified mononeuropathy of unspecified lower limb: Secondary | ICD-10-CM

## 2014-04-08 DIAGNOSIS — G5720 Lesion of femoral nerve, unspecified lower limb: Secondary | ICD-10-CM

## 2014-04-08 DIAGNOSIS — R202 Paresthesia of skin: Secondary | ICD-10-CM

## 2014-04-08 DIAGNOSIS — R937 Abnormal findings on diagnostic imaging of other parts of musculoskeletal system: Secondary | ICD-10-CM

## 2014-04-08 DIAGNOSIS — R209 Unspecified disturbances of skin sensation: Secondary | ICD-10-CM

## 2014-04-08 DIAGNOSIS — M792 Neuralgia and neuritis, unspecified: Secondary | ICD-10-CM

## 2014-04-08 NOTE — Telephone Encounter (Signed)
Message copied by Donne AnonPLUMMER, Ettel Albergo M on Wed Apr 08, 2014  4:01 PM ------      Message from: Allayne ButcherIXON, MARY      Created: Wed Apr 08, 2014 12:18 PM       Pt had recent office visit at which time he was complaining of paresthesias in the left anterior thigh.      At that visit I ordered a lumbar spine x-ray and Nerve conduction studies.      Tell Patient that the x-ray does show some abnormalities in the lumbar spine.      I Think his symptoms very well may be secondary to these abnormalities.      Tell him that we will get an MRI of lumbar spine and hold off on getting nerve conduction studies for now.      Please cancel the order for the nerve conduction studies.      Please place order for MRI lumbar spine. ------

## 2014-04-08 NOTE — Telephone Encounter (Signed)
Pt called with lumbar xray results.  MRI ordered. Nerve conduction studies has been canceled

## 2014-04-14 ENCOUNTER — Ambulatory Visit
Admission: RE | Admit: 2014-04-14 | Discharge: 2014-04-14 | Disposition: A | Discharge status: Home / Self Care | Payer: Managed Care, Other (non HMO) | Source: Ambulatory Visit | Attending: Physician Assistant | Admitting: Physician Assistant

## 2014-04-14 DIAGNOSIS — R937 Abnormal findings on diagnostic imaging of other parts of musculoskeletal system: Secondary | ICD-10-CM

## 2014-04-14 DIAGNOSIS — G5790 Unspecified mononeuropathy of unspecified lower limb: Secondary | ICD-10-CM

## 2014-04-14 DIAGNOSIS — R209 Unspecified disturbances of skin sensation: Secondary | ICD-10-CM

## 2014-04-17 ENCOUNTER — Telehealth: Payer: Self-pay | Admitting: Family Medicine

## 2014-04-17 NOTE — Telephone Encounter (Signed)
Discussed MRI results with patient.  Recommended the two specialist per provider.  Patient wants to look into who he wants to see and then will call us back and let us know who he wants to see.

## 2014-04-17 NOTE — Telephone Encounter (Signed)
Message copied by Donne AnonPLUMMER, Johonna Binette M on Fri Apr 17, 2014  3:14 PM ------      Message from: Allayne ButcherIXON, MARY      Created: Wed Apr 15, 2014  7:38 AM       Tell patient that MRI shows multiple abnormalities including bulging discs.      At his recent office visit, he was complaining of burning pain and numb tingly sensation in his left thigh. Tell him that most likely the symptoms are secondary to these abnormalities of the lumbar spine.      Tell him I recommend he followup with a spine specialist.      Place order for referral--- [facility] or can go to a neurosurgeon or Dr. Noel Geroldohen at the spine specialist. ------

## 2014-05-06 ENCOUNTER — Other Ambulatory Visit: Payer: Self-pay | Admitting: Family Medicine

## 2014-05-06 ENCOUNTER — Other Ambulatory Visit: Payer: Self-pay | Admitting: *Deleted

## 2014-05-06 MED ORDER — LEVOTHYROXINE SODIUM 150 MCG PO TABS: ORAL_TABLET | ORAL | Status: DC

## 2014-05-06 NOTE — Telephone Encounter (Signed)
Received fax requesting refill on Levothyroxine.   Refill appropriate and filled per protocol. 

## 2014-05-06 NOTE — Telephone Encounter (Signed)
Refill appropriate and filled per protocol. 

## 2014-05-07 ENCOUNTER — Other Ambulatory Visit: Payer: Self-pay | Admitting: *Deleted

## 2014-05-07 NOTE — Telephone Encounter (Signed)
Received fax requesting refill on Levothyroxine.   Prescription was sent to pharmacy on 05/06/2014.

## 2014-08-12 DIAGNOSIS — H40051 Ocular hypertension, right eye: Secondary | ICD-10-CM | POA: Insufficient documentation

## 2014-08-12 DIAGNOSIS — H1131 Conjunctival hemorrhage, right eye: Secondary | ICD-10-CM | POA: Insufficient documentation

## 2014-08-27 ENCOUNTER — Other Ambulatory Visit: Payer: Managed Care, Other (non HMO)

## 2014-08-27 DIAGNOSIS — Z79899 Other long term (current) drug therapy: Secondary | ICD-10-CM

## 2014-08-27 DIAGNOSIS — E669 Obesity, unspecified: Secondary | ICD-10-CM

## 2014-08-27 DIAGNOSIS — E785 Hyperlipidemia, unspecified: Secondary | ICD-10-CM

## 2014-08-27 DIAGNOSIS — Z0000 Encounter for general adult medical examination without abnormal findings: Secondary | ICD-10-CM

## 2014-08-27 DIAGNOSIS — I10 Essential (primary) hypertension: Secondary | ICD-10-CM

## 2014-08-27 DIAGNOSIS — E039 Hypothyroidism, unspecified: Secondary | ICD-10-CM

## 2014-08-27 LAB — COMPLETE METABOLIC PANEL WITH GFR
ALK PHOS: 63 U/L (ref 39–117)
ALT: 24 U/L (ref 0–53)
AST: 21 U/L (ref 0–37)
Albumin: 4.4 g/dL (ref 3.5–5.2)
BILIRUBIN TOTAL: 0.6 mg/dL (ref 0.2–1.2)
BUN: 14 mg/dL (ref 6–23)
CO2: 26 meq/L (ref 19–32)
Calcium: 10 mg/dL (ref 8.4–10.5)
Chloride: 102 mEq/L (ref 96–112)
Creat: 1.04 mg/dL (ref 0.50–1.35)
GFR, Est Non African American: 80 mL/min
GLUCOSE: 75 mg/dL (ref 70–99)
Potassium: 5 mEq/L (ref 3.5–5.3)
Sodium: 138 mEq/L (ref 135–145)
Total Protein: 7.1 g/dL (ref 6.0–8.3)

## 2014-08-27 LAB — LIPID PANEL
CHOLESTEROL: 196 mg/dL (ref 0–200)
HDL: 47 mg/dL (ref >39)
LDL Cholesterol: 123 mg/dL — ABNORMAL HIGH (ref 0–99)
TRIGLYCERIDES: 132 mg/dL (ref <150)
Total CHOL/HDL Ratio: 4.2 Ratio
VLDL: 26 mg/dL (ref 0–40)

## 2014-08-27 LAB — CBC WITH DIFFERENTIAL/PLATELET
BASOS PCT: 1 % (ref 0–1)
Basophils Absolute: 0.1 10*3/uL (ref 0.0–0.1)
Eosinophils Absolute: 0.1 10*3/uL (ref 0.0–0.7)
Eosinophils Relative: 2 % (ref 0–5)
HCT: 42.9 % (ref 39.0–52.0)
Hemoglobin: 14.8 g/dL (ref 13.0–17.0)
Lymphocytes Relative: 29 % (ref 12–46)
Lymphs Abs: 1.7 10*3/uL (ref 0.7–4.0)
MCH: 30.6 pg (ref 26.0–34.0)
MCHC: 34.5 g/dL (ref 30.0–36.0)
MCV: 88.6 fL (ref 78.0–100.0)
MONO ABS: 0.6 10*3/uL (ref 0.1–1.0)
MONOS PCT: 10 % (ref 3–12)
MPV: 9.2 fL — ABNORMAL LOW (ref 9.4–12.4)
Neutro Abs: 3.4 10*3/uL (ref 1.7–7.7)
Neutrophils Relative %: 58 % (ref 43–77)
Platelets: 273 10*3/uL (ref 150–400)
RBC: 4.84 MIL/uL (ref 4.22–5.81)
RDW: 13.3 % (ref 11.5–15.5)
WBC: 5.9 10*3/uL (ref 4.0–10.5)

## 2014-08-27 LAB — TSH: TSH: 14.457 u[IU]/mL — AB (ref 0.350–4.500)

## 2014-08-28 ENCOUNTER — Encounter: Payer: Self-pay | Admitting: Family Medicine

## 2014-08-28 ENCOUNTER — Ambulatory Visit (INDEPENDENT_AMBULATORY_CARE_PROVIDER_SITE_OTHER): Payer: Managed Care, Other (non HMO) | Admitting: Family Medicine

## 2014-08-28 VITALS — BP 130/78 | HR 68 | Temp 98.3°F | Resp 18 | Wt 282.0 lb

## 2014-08-28 DIAGNOSIS — Z125 Encounter for screening for malignant neoplasm of prostate: Secondary | ICD-10-CM

## 2014-08-28 DIAGNOSIS — E785 Hyperlipidemia, unspecified: Secondary | ICD-10-CM

## 2014-08-28 DIAGNOSIS — E038 Other specified hypothyroidism: Secondary | ICD-10-CM

## 2014-08-28 NOTE — Progress Notes (Signed)
Subjective:    Patient ID: Juan Stone, male    DOB: [birth year], 53 y.o.   MRN: [id number]  HPI  Patient is here today for follow-up of his hyperlipidemia as well as hypothyroidism. His TSH has risen from 2.5 to over 14. His LDL cholesterol has risen from 90-123. The patient has gained approximately 12 pounds. He admits that he is not exercising. He denies any chest pain shortness of breath or dyspnea on exertion. He admits that he has missed several doses of his thyroid. He would like to recheck a TSH next week to confirm this prior to making any changes in his medication. He became frustrated in the past when we had made numerous changes in his thyroid dosage. His flu shot is up-to-date. He denies any myalgias or right upper quadrant pain on his cholesterol medication. He is due for a PSA. Past Medical History  Diagnosis Date  . Hyperlipidemia   . Thyroid disease     hypothyroidism  . Obesity    Past Surgical History  Procedure Laterality Date  . Corneal transplant      Fuch's dystorphy (bilateral)   Current Outpatient Prescriptions on File Prior to Visit  Medication Sig Dispense Refill  . fluticasone (FLONASE) 50 MCG/ACT nasal spray Place 2 sprays into both nostrils daily. 16 g 11  . levothyroxine (SYNTHROID, LEVOTHROID) 150 MCG tablet TAKE 1 TABLET DAILY 90 tablet 3  . prednisoLONE acetate (PRED FORTE) 1 % ophthalmic suspension Place 1 drop into the right eye 2 (two) times daily.    . simvastatin (ZOCOR) 20 MG tablet TAKE 1 TABLET AT BEDTIME 90 tablet 1  . sodium chloride (MURO 128) 5 % ophthalmic solution Place 1 drop into the left eye 2 (two) times daily.     No current facility-administered medications on file prior to visit.   No Known Allergies History   Social History  . Marital Status: Married    Spouse Name: N/A    Number of Children: N/A  . Years of Education: N/A   Occupational History  . Not on file.   Social History Main Topics  . Smoking status: Never  Smoker   . Smokeless tobacco: Never Used  . Alcohol Use: No     Comment: Ocassional  . Drug Use: No  . Sexual Activity: Not on file   Other Topics Concern  . Not on file   Social History Narrative     Review of Systems  All other systems reviewed and are negative.      Objective:   Physical Exam  Constitutional: He appears well-developed.  HENT:  Right Ear: External ear normal.  Left Ear: External ear normal.  Nose: Nose normal.  Mouth/Throat: Oropharynx is clear and moist. No oropharyngeal exudate.  Neck: Neck supple. No thyromegaly present.  Cardiovascular: Normal rate, regular rhythm and normal heart sounds.  Exam reveals no gallop and no friction rub.   No murmur heard. Pulmonary/Chest: Effort normal and breath sounds normal. No respiratory distress. He has no wheezes. He has no rales.  Abdominal: Soft. Bowel sounds are normal. He exhibits no distension. There is no tenderness. There is no rebound and no guarding.  Musculoskeletal: He exhibits no edema.  Lymphadenopathy:    He has no cervical adenopathy.  Vitals reviewed.         Assessment & Plan:  Other specified hypothyroidism - Plan: TSH  HLD (hyperlipidemia)  Prostate cancer screening - Plan: PSA  Patient's blood pressures acceptable. His LDL cholesterol is  still acceptable although it has risen significantly. I believe this is due to his 12 pound weight gain. I encouraged the patient to try to diet exercise and lose weight. I recommended 30 minutes a day 5 days a week of aerobic exercise. Patient would like to come back next week and recheck a TSH. If his TSH is still elevated, I will switch the patient to levothyroxine  175 g by mouth daily. I would recheck a TSH in 2 months. I will also screen a PSA when he returns

## 2014-09-03 ENCOUNTER — Other Ambulatory Visit: Payer: Managed Care, Other (non HMO)

## 2014-09-03 DIAGNOSIS — Z125 Encounter for screening for malignant neoplasm of prostate: Secondary | ICD-10-CM

## 2014-09-03 DIAGNOSIS — E038 Other specified hypothyroidism: Secondary | ICD-10-CM

## 2014-09-04 ENCOUNTER — Other Ambulatory Visit: Payer: Self-pay | Admitting: Family Medicine

## 2014-09-04 ENCOUNTER — Encounter: Payer: Self-pay | Admitting: Family Medicine

## 2014-09-04 LAB — TSH: TSH: 9.697 u[IU]/mL — ABNORMAL HIGH (ref 0.350–4.500)

## 2014-09-04 LAB — PSA: PSA: 0.48 ng/mL (ref <=4.00)

## 2014-09-04 MED ORDER — LEVOTHYROXINE SODIUM 175 MCG PO TABS: 175.0000 ug | ORAL_TABLET | Freq: Every day | ORAL | Status: DC

## 2014-09-08 ENCOUNTER — Other Ambulatory Visit: Payer: Self-pay | Admitting: Physical Medicine and Rehabilitation

## 2014-09-08 DIAGNOSIS — M545 Low back pain: Secondary | ICD-10-CM

## 2014-09-09 ENCOUNTER — Encounter: Payer: Self-pay | Admitting: Physician Assistant

## 2014-09-09 ENCOUNTER — Ambulatory Visit (INDEPENDENT_AMBULATORY_CARE_PROVIDER_SITE_OTHER): Payer: Managed Care, Other (non HMO) | Admitting: Physician Assistant

## 2014-09-09 VITALS — BP 124/70 | HR 68 | Temp 98.8°F | Resp 18 | Wt 282.0 lb

## 2014-09-09 DIAGNOSIS — Z23 Encounter for immunization: Secondary | ICD-10-CM

## 2014-09-09 DIAGNOSIS — E669 Obesity, unspecified: Secondary | ICD-10-CM

## 2014-09-09 DIAGNOSIS — E039 Hypothyroidism, unspecified: Secondary | ICD-10-CM

## 2014-09-09 DIAGNOSIS — E785 Hyperlipidemia, unspecified: Secondary | ICD-10-CM

## 2014-09-09 NOTE — Progress Notes (Signed)
Patient ID: Juan Stone MRN: [id number], DOB: [birth year], 53 y.o. Date of Encounter: 09/09/2014, 4:25 PM    Chief Complaint:  Chief Complaint  Patient presents with  . c/o tired all the time      . Injections    wants TDAP     HPI: 53 y.o. year old white male says that the reason he came here to start with was to get a tetanus shot. Says that my chart message told him that he was due for a tetanus shot. Says that he had met his deductible for the year and had the day off work so decided he would come in and get this while it could be free and he had the time available to do it. However, once he got here, we reviewed his paper chart and he had a tetanus shot in 2007. This just had not been abstracted into Epic.  He says that because he was coming here anyway he thought he would let us know that he had been feeling fatigued recently. Says that he recently started back using his CPAP and he thought that would help but he really isn't feeling much more energy and isn't feeling much better.     Home Meds:   Outpatient Prescriptions Prior to Visit  Medication Sig Dispense Refill  . fluticasone (FLONASE) 50 MCG/ACT nasal spray Place 2 sprays into both nostrils daily. 16 g 11  . Ginkgo Biloba 40 MG TABS Take 1 tablet by mouth.    . levothyroxine (SYNTHROID, LEVOTHROID) 175 MCG tablet Take 1 tablet (175 mcg total) by mouth daily before breakfast. 90 tablet 3  . LOTEMAX 0.5 % GEL   3  . Multiple Vitamins tablet Take 1 tablet by mouth.    . prednisoLONE acetate (PRED FORTE) 1 % ophthalmic suspension Place 1 drop into the right eye 2 (two) times daily.    . simvastatin (ZOCOR) 20 MG tablet TAKE 1 TABLET AT BEDTIME 90 tablet 1  . levothyroxine (SYNTHROID, LEVOTHROID) 150 MCG tablet TAKE 1 TABLET DAILY 90 tablet 3  . sodium chloride (MURO 128) 5 % ophthalmic solution Place 1 drop into the left eye 2 (two) times daily.     No facility-administered medications prior to visit.     Allergies: No Known Allergies    Review of Systems: See HPI for pertinent ROS. All other ROS negative.    Physical Exam: Blood pressure 124/70, pulse 68, temperature 98.8 F (37.1 C), temperature source Oral, resp. rate 18, weight 282 lb (127.914 kg)., Body mass index is 39.88 kg/(m^2). General:  Obese white male . Appears in no acute distress. Neck: Supple. No thyromegaly. No lymphadenopathy. Lungs: Clear bilaterally to auscultation without wheezes, rales, or rhonchi. Breathing is unlabored. Heart: Regular rhythm. No murmurs, rubs, or gallops. Msk:  Strength and tone normal for age. Extremities/Skin: Warm and dry. Neuro: Alert and oriented X 3. Moves all extremities spontaneously. Gait is normal. CNII-XII grossly in tact. Psych:  Responds to questions appropriately with a normal affect.     ASSESSMENT AND PLAN:  53 y.o. year old male with  1. Hypothyroidism, unspecified hypothyroidism type  2. Obesity  3. Hyperlipidemia  Reviewed that he just had routine office visit with Dr. Dennard Schaumann December 11. Reviewed that he just had full panel of labs on 08/27/14. These included CBC, CMP T, TSH, FLP, PSA  Reviewed that his TSH was elevated indicating that his thyroid was too low so Dr. Dennard Schaumann has increased the thyroid dose. Discussed this  that clinical hypothyroidism would definitely cause fatigue. Also discussed the fact that Dr. Samella Parr note mentioned that he had had a 12 pound weight gain. Think that his fatigue is secondary to his thyroid and weight gain. He assured him that he had no anemia and sugar and electrolytes kidney and liver were all normal. Improved diet and increased exercise would help his fatigue level as well as his other medical problems.  We informed him that technically he does not have to get a T dap until 2017. However he wants to go ahead and receive this today while he is here.  [address] [place], [place], [facility] 09/09/2014 4:25 PM

## 2014-09-15 ENCOUNTER — Telehealth: Payer: Self-pay | Admitting: Family Medicine

## 2014-09-15 DIAGNOSIS — E039 Hypothyroidism, unspecified: Secondary | ICD-10-CM

## 2014-09-15 DIAGNOSIS — Z79899 Other long term (current) drug therapy: Secondary | ICD-10-CM

## 2014-09-15 NOTE — Telephone Encounter (Signed)
Have not been able to reach patient.  New Rx for levothyroxine already to pharmacy.

## 2014-09-15 NOTE — Telephone Encounter (Signed)
-----   Message from Donita BrooksWarren T Pickard, MD sent at 09/04/2014  7:14 AM EST ----- TSh is 9.6 which is better but not ideal.  I would increase levothyroine to 175 mcg poqday and recheck TSH in 2-3 months.

## 2014-09-17 ENCOUNTER — Other Ambulatory Visit: Payer: Self-pay | Admitting: Physical Medicine and Rehabilitation

## 2014-09-17 ENCOUNTER — Ambulatory Visit
Admission: RE | Admit: 2014-09-17 | Discharge: 2014-09-17 | Disposition: A | Discharge status: Home / Self Care | Payer: Managed Care, Other (non HMO) | Source: Ambulatory Visit | Attending: Physical Medicine and Rehabilitation | Admitting: Physical Medicine and Rehabilitation

## 2014-09-17 DIAGNOSIS — M545 Low back pain: Secondary | ICD-10-CM

## 2014-09-17 MED ORDER — IOHEXOL 180 MG/ML  SOLN
1.0000 mL | Freq: Once | INTRAMUSCULAR | Status: AC | PRN
  Administered 2014-09-17: 1 mL via EPIDURAL

## 2014-09-17 MED ORDER — METHYLPREDNISOLONE ACETATE 40 MG/ML INJ SUSP (RADIOLOG
120.0000 mg | Freq: Once | INTRAMUSCULAR | Status: AC
  Administered 2014-09-17: 120 mg via EPIDURAL

## 2014-09-17 NOTE — Telephone Encounter (Deleted)
-----   Message from Warren T Pickard, MD sent at 09/04/2014  7:14 AM EST ----- TSh is 9.6 which is better but not ideal.  I would increase levothyroine to 175 mcg poqday and recheck TSH in 2-3 months. 

## 2014-09-17 NOTE — Discharge Instructions (Signed)

## 2014-09-17 NOTE — Telephone Encounter (Signed)
Pt says was aware of lab result.  Has started new dose of Thyroid medication.  Does not want to wait 2-3 month to see if correct dose.  Told need to at least give it 6 weeks before we can repeat TSH.  Pt understands.  Future order placed.

## 2014-10-30 ENCOUNTER — Other Ambulatory Visit: Payer: Managed Care, Other (non HMO)

## 2014-10-30 DIAGNOSIS — E039 Hypothyroidism, unspecified: Secondary | ICD-10-CM

## 2014-10-30 DIAGNOSIS — Z79899 Other long term (current) drug therapy: Secondary | ICD-10-CM

## 2014-10-30 LAB — TSH: TSH: 0.161 u[IU]/mL — ABNORMAL LOW (ref 0.350–4.500)

## 2014-12-28 ENCOUNTER — Encounter: Payer: Self-pay | Admitting: Family Medicine

## 2014-12-29 ENCOUNTER — Other Ambulatory Visit: Payer: Managed Care, Other (non HMO)

## 2014-12-29 DIAGNOSIS — Z79899 Other long term (current) drug therapy: Secondary | ICD-10-CM

## 2014-12-29 DIAGNOSIS — E039 Hypothyroidism, unspecified: Secondary | ICD-10-CM

## 2014-12-29 LAB — TSH: TSH: 0.593 u[IU]/mL (ref 0.350–4.500)

## 2015-01-01 ENCOUNTER — Encounter: Payer: Self-pay | Admitting: *Deleted

## 2015-01-06 ENCOUNTER — Telehealth: Payer: Self-pay | Admitting: Family Medicine

## 2015-01-06 MED ORDER — SIMVASTATIN 20 MG PO TABS: 20.0000 mg | ORAL_TABLET | Freq: Every day | ORAL | Status: DC

## 2015-01-06 MED ORDER — LEVOTHYROXINE SODIUM 175 MCG PO TABS: 175.0000 ug | ORAL_TABLET | Freq: Every day | ORAL | Status: DC

## 2015-01-06 NOTE — Telephone Encounter (Addendum)
Patient needs refill on simvastatin, and the levothyroxine refilled and [facility] [phone] (pharmacy changed) he said make sure the levothyroxine is 175 mg

## 2015-01-06 NOTE — Telephone Encounter (Signed)
Med sent to requested pharm 

## 2015-03-08 ENCOUNTER — Encounter: Payer: Self-pay | Admitting: Family Medicine

## 2015-03-08 ENCOUNTER — Ambulatory Visit (INDEPENDENT_AMBULATORY_CARE_PROVIDER_SITE_OTHER): Payer: Managed Care, Other (non HMO) | Admitting: Family Medicine

## 2015-03-08 VITALS — BP 110/74 | HR 68 | Temp 98.6°F | Resp 16 | Ht 71.0 in | Wt 282.0 lb

## 2015-03-08 DIAGNOSIS — T148 Other injury of unspecified body region: Secondary | ICD-10-CM | POA: Diagnosis not present

## 2015-03-08 DIAGNOSIS — W57XXXA Bitten or stung by nonvenomous insect and other nonvenomous arthropods, initial encounter: Secondary | ICD-10-CM

## 2015-03-08 DIAGNOSIS — B372 Candidiasis of skin and nail: Secondary | ICD-10-CM | POA: Diagnosis not present

## 2015-03-08 LAB — CBC WITH DIFFERENTIAL/PLATELET
Basophils Absolute: 0.1 10*3/uL (ref 0.0–0.1)
Basophils Relative: 1 % (ref 0–1)
EOS PCT: 4 % (ref 0–5)
Eosinophils Absolute: 0.2 10*3/uL (ref 0.0–0.7)
HEMATOCRIT: 42.5 % (ref 39.0–52.0)
HEMOGLOBIN: 14.7 g/dL (ref 13.0–17.0)
LYMPHS ABS: 1.5 10*3/uL (ref 0.7–4.0)
Lymphocytes Relative: 28 % (ref 12–46)
MCH: 30.8 pg (ref 26.0–34.0)
MCHC: 34.6 g/dL (ref 30.0–36.0)
MCV: 89.1 fL (ref 78.0–100.0)
MONO ABS: 0.4 10*3/uL (ref 0.1–1.0)
MONOS PCT: 7 % (ref 3–12)
MPV: 9.3 fL (ref 8.6–12.4)
NEUTROS PCT: 60 % (ref 43–77)
Neutro Abs: 3.2 10*3/uL (ref 1.7–7.7)
Platelets: 240 10*3/uL (ref 150–400)
RBC: 4.77 MIL/uL (ref 4.22–5.81)
RDW: 13.2 % (ref 11.5–15.5)
WBC: 5.4 10*3/uL (ref 4.0–10.5)

## 2015-03-08 LAB — COMPLETE METABOLIC PANEL WITH GFR
ALT: 25 U/L (ref 0–53)
AST: 21 U/L (ref 0–37)
Albumin: 4.2 g/dL (ref 3.5–5.2)
Alkaline Phosphatase: 64 U/L (ref 39–117)
BUN: 13 mg/dL (ref 6–23)
CALCIUM: 9.1 mg/dL (ref 8.4–10.5)
CO2: 24 mEq/L (ref 19–32)
CREATININE: 0.91 mg/dL (ref 0.50–1.35)
Chloride: 104 mEq/L (ref 96–112)
GFR, Est African American: >89 mL/min
Glucose, Bld: 89 mg/dL (ref 70–99)
Potassium: 4.6 mEq/L (ref 3.5–5.3)
Sodium: 141 mEq/L (ref 135–145)
Total Bilirubin: 0.5 mg/dL (ref 0.2–1.2)
Total Protein: 6.6 g/dL (ref 6.0–8.3)

## 2015-03-08 MED ORDER — CLOTRIMAZOLE-BETAMETHASONE 1-0.05 % EX CREA: 1.0000 "application " | TOPICAL_CREAM | Freq: Two times a day (BID) | CUTANEOUS | Status: DC

## 2015-03-08 NOTE — Addendum Note (Signed)
Addended by: Alean Rinne A on: 03/08/2015 10:30 AM   Modules accepted: Orders

## 2015-03-08 NOTE — Progress Notes (Signed)
Subjective:    Patient ID: Juan Stone, male    DOB: 1961-04-09, 54 y.o.   MRN: [id number]  HPI Patient is a very pleasant 54 year old white male. He is been bitten by several ticks this summer. The patient has 2 erythematous papules approximately 5 mm in diameter on his medial left thigh just superior to his knee. There is papules have been there for about 4 weeks. He also has a similar lesion in between his first and second toe on his foot. He denies any rash consistent with erythema migrans. He does complain of fatigue and joint pains and muscle pains that have been present for about a month that coincided temporally with the time of the tick bite. He is concerned he may been exposed to Lyme disease. He also has a bright red erythematous rash in his right axilla that has been there for a week. It is a fire a red rash with satellite erythematous macules consistent with Candida intertrigo. Past Medical History  Diagnosis Date  . Hyperlipidemia   . Thyroid disease     hypothyroidism  . Obesity    Past Surgical History  Procedure Laterality Date  . Corneal transplant      Fuch's dystorphy (bilateral)   Current Outpatient Prescriptions on File Prior to Visit  Medication Sig Dispense Refill  . fluticasone (FLONASE) 50 MCG/ACT nasal spray Place 2 sprays into both nostrils daily. 16 g 11  . levothyroxine (SYNTHROID, LEVOTHROID) 175 MCG tablet Take 1 tablet (175 mcg total) by mouth daily before breakfast. 90 tablet 3  . Multiple Vitamins tablet Take 1 tablet by mouth.    . prednisoLONE acetate (PRED FORTE) 1 % ophthalmic suspension Place 1 drop into the right eye 2 (two) times daily.    . simvastatin (ZOCOR) 20 MG tablet Take 1 tablet (20 mg total) by mouth at bedtime. 90 tablet 3  . Ginkgo Biloba 40 MG TABS Take 1 tablet by mouth.     No current facility-administered medications on file prior to visit.   No Known Allergies History   Social History  . Marital Status: Married   Spouse Name: N/A  . Number of Children: N/A  . Years of Education: N/A   Occupational History  . Not on file.   Social History Main Topics  . Smoking status: Never Smoker   . Smokeless tobacco: Never Used  . Alcohol Use: No     Comment: Ocassional  . Drug Use: No  . Sexual Activity: Not on file   Other Topics Concern  . Not on file   Social History Narrative      Review of Systems  All other systems reviewed and are negative.      Objective:   Physical Exam  Constitutional: He appears well-developed and well-nourished.  Cardiovascular: Normal rate, regular rhythm and normal heart sounds.   No murmur heard. Pulmonary/Chest: Effort normal and breath sounds normal.  Abdominal: Soft. Bowel sounds are normal.  Skin: Rash noted. There is erythema.  Vitals reviewed.         Assessment & Plan:  Tick bite - Plan: B. burgdorfi antibodies by WB, B. burgdorfi antibodies by WB, CBC with Differential/Platelet, COMPLETE METABOLIC PANEL WITH GFR, [place] spotted fvr abs pnl(IgG+IgM)  Candidal intertrigo - Plan: clotrimazole-betamethasone (LOTRISONE) cream  Patient does not have the symptoms of [facility] spotted fever. The rash on his legs looks like a local immune reaction to the tick bite rather than erythema migrans. Therefore I do not  believe he has Lyme disease. However to be thorough, I will check lab work to monitor for antibodies to Lyme disease. If positive, I will start the patient on 21 days of doxycycline. Otherwise I recommended tincture of time and I anticipate that the erythematous 5 mm papules which are papular urticaria will resolve spontaneously over the next 2 or 3 weeks. I will treat the Candida intertrigo in his right axilla with Lotrisone cream twice a day for 7-10 days.

## 2015-03-10 LAB — ROCKY MTN SPOTTED FVR ABS PNL(IGG+IGM)
RMSF IgG: 2.71 IV — ABNORMAL HIGH
RMSF IgM: 0.47 IV

## 2015-03-11 ENCOUNTER — Encounter: Payer: Self-pay | Admitting: Family Medicine

## 2015-03-11 MED ORDER — DOXYCYCLINE HYCLATE 100 MG PO TABS: 100.0000 mg | ORAL_TABLET | Freq: Two times a day (BID) | ORAL | Status: DC

## 2015-03-11 NOTE — Telephone Encounter (Signed)
Pt called office.  Made aware of current results.  Rx to pharmacy

## 2015-03-11 NOTE — Telephone Encounter (Signed)
-----   Message from Donita Brooks, MD sent at 03/11/2015  7:31 AM EDT ----- So far, labs show possible remote infection with RMSF.  I think this is a false positive.  Lyme test is pending.  We could start him on doxycycline 100 bid for 10 days as a CYA until results return

## 2015-03-11 NOTE — Addendum Note (Signed)
Addended by: Phillips Odor on: 03/11/2015 10:43 AM   Modules accepted: Orders

## 2015-03-12 LAB — B. BURGDORFI ANTIBODIES BY WB
B BURGDORFERI IGM ABS (IB): NEGATIVE
B burgdorferi IgG Abs (IB): NEGATIVE

## 2015-05-05 ENCOUNTER — Other Ambulatory Visit: Payer: Self-pay | Admitting: Family Medicine

## 2015-10-08 ENCOUNTER — Encounter: Payer: Self-pay | Admitting: Family Medicine

## 2015-10-08 ENCOUNTER — Ambulatory Visit (INDEPENDENT_AMBULATORY_CARE_PROVIDER_SITE_OTHER): Payer: BLUE CROSS/BLUE SHIELD | Admitting: Family Medicine

## 2015-10-08 VITALS — BP 118/72 | HR 78 | Temp 98.6°F | Resp 18 | Wt 284.0 lb

## 2015-10-08 DIAGNOSIS — J019 Acute sinusitis, unspecified: Secondary | ICD-10-CM

## 2015-10-08 MED ORDER — AMOXICILLIN-POT CLAVULANATE 875-125 MG PO TABS: 1.0000 | ORAL_TABLET | Freq: Two times a day (BID) | ORAL | Status: DC

## 2015-10-08 NOTE — Progress Notes (Signed)
   Subjective:    Patient ID: Juan Stone, male    DOB: 1961/06/15, 55 y.o.   MRN: [id number]  HPI Patient reports 4 weeks of pain and pressure in his frontal sinuses, rhinorrhea, persistent daily headache, postnasal drip, and pressure in his maxillary sinuses. His right ear also feels stopped up and he has a serous effusion behind his right tympanic membrane Past Medical History  Diagnosis Date  . Hyperlipidemia   . Thyroid disease     hypothyroidism  . Obesity    Past Surgical History  Procedure Laterality Date  . Corneal transplant      Fuch's dystorphy (bilateral)   Current Outpatient Prescriptions on File Prior to Visit  Medication Sig Dispense Refill  . clotrimazole-betamethasone (LOTRISONE) cream APPLY TO AFFECTED AREA TWICE A DAY 30 g 1  . fluticasone (FLONASE) 50 MCG/ACT nasal spray Place 2 sprays into both nostrils daily. 16 g 11  . Ginkgo Biloba 40 MG TABS Take 1 tablet by mouth.    . levothyroxine (SYNTHROID, LEVOTHROID) 175 MCG tablet Take 1 tablet (175 mcg total) by mouth daily before breakfast. 90 tablet 3  . Multiple Vitamins tablet Take 1 tablet by mouth.    . prednisoLONE acetate (PRED FORTE) 1 % ophthalmic suspension Place 1 drop into the right eye 2 (two) times daily.    . simvastatin (ZOCOR) 20 MG tablet Take 1 tablet (20 mg total) by mouth at bedtime. 90 tablet 3   No current facility-administered medications on file prior to visit.   No Known Allergies Social History   Social History  . Marital Status: Married    Spouse Name: N/A  . Number of Children: N/A  . Years of Education: N/A   Occupational History  . Not on file.   Social History Main Topics  . Smoking status: Never Smoker   . Smokeless tobacco: Never Used  . Alcohol Use: No     Comment: Ocassional  . Drug Use: No  . Sexual Activity: Not on file   Other Topics Concern  . Not on file   Social History Narrative      Review of Systems  All other systems reviewed and are  negative.      Objective:   Physical Exam  HENT:  Right Ear: External ear normal.  Left Ear: External ear normal.  Nose: Mucosal edema and rhinorrhea present. Right sinus exhibits frontal sinus tenderness. Left sinus exhibits frontal sinus tenderness.  Mouth/Throat: Oropharynx is clear and moist. No oropharyngeal exudate.  Cardiovascular: Normal rate, regular rhythm and normal heart sounds.   Pulmonary/Chest: Effort normal and breath sounds normal. No respiratory distress. He has no wheezes. He has no rales.  Vitals reviewed.         Assessment & Plan:  Acute rhinosinusitis - Plan: amoxicillin-clavulanate (AUGMENTIN) 875-125 MG tablet  Patient has sinusitis. Begin Augmentin 875 mg by mouth twice a day for 10 days. Use Flonase and nasal saline to facilitate drainage.

## 2015-10-11 ENCOUNTER — Other Ambulatory Visit: Payer: Self-pay | Admitting: Family Medicine

## 2015-10-11 ENCOUNTER — Other Ambulatory Visit: Payer: BLUE CROSS/BLUE SHIELD

## 2015-10-11 DIAGNOSIS — Z125 Encounter for screening for malignant neoplasm of prostate: Secondary | ICD-10-CM

## 2015-10-11 DIAGNOSIS — E039 Hypothyroidism, unspecified: Secondary | ICD-10-CM

## 2015-10-11 DIAGNOSIS — E785 Hyperlipidemia, unspecified: Secondary | ICD-10-CM

## 2015-10-11 LAB — CBC WITH DIFFERENTIAL/PLATELET
BASOS ABS: 0.1 10*3/uL (ref 0.0–0.1)
Basophils Relative: 1 % (ref 0–1)
EOS ABS: 0.1 10*3/uL (ref 0.0–0.7)
EOS PCT: 2 % (ref 0–5)
HCT: 46.5 % (ref 39.0–52.0)
Hemoglobin: 15.8 g/dL (ref 13.0–17.0)
Lymphocytes Relative: 26 % (ref 12–46)
Lymphs Abs: 1.4 10*3/uL (ref 0.7–4.0)
MCH: 31.2 pg (ref 26.0–34.0)
MCHC: 34 g/dL (ref 30.0–36.0)
MCV: 91.7 fL (ref 78.0–100.0)
MONO ABS: 0.5 10*3/uL (ref 0.1–1.0)
MPV: 9.4 fL (ref 8.6–12.4)
Monocytes Relative: 10 % (ref 3–12)
Neutro Abs: 3.3 10*3/uL (ref 1.7–7.7)
Neutrophils Relative %: 61 % (ref 43–77)
PLATELETS: 269 10*3/uL (ref 150–400)
RBC: 5.07 MIL/uL (ref 4.22–5.81)
RDW: 13.3 % (ref 11.5–15.5)
WBC: 5.4 10*3/uL (ref 4.0–10.5)

## 2015-10-11 LAB — COMPLETE METABOLIC PANEL WITH GFR
ALT: 21 U/L (ref 9–46)
AST: 21 U/L (ref 10–35)
Albumin: 4.5 g/dL (ref 3.6–5.1)
Alkaline Phosphatase: 69 U/L (ref 40–115)
BUN: 13 mg/dL (ref 7–25)
CALCIUM: 9.8 mg/dL (ref 8.6–10.3)
CHLORIDE: 100 mmol/L (ref 98–110)
CO2: 29 mmol/L (ref 20–31)
CREATININE: 0.95 mg/dL (ref 0.70–1.33)
GFR, Est Non African American: 88 mL/min (ref >=60)
Glucose, Bld: 88 mg/dL (ref 70–99)
POTASSIUM: 5.1 mmol/L (ref 3.5–5.3)
Sodium: 139 mmol/L (ref 135–146)
Total Bilirubin: 0.6 mg/dL (ref 0.2–1.2)
Total Protein: 7.1 g/dL (ref 6.1–8.1)

## 2015-10-11 LAB — TSH: TSH: 23.501 u[IU]/mL — AB (ref 0.350–4.500)

## 2015-10-11 LAB — LDL CHOLESTEROL, DIRECT: Direct LDL: 118 mg/dL (ref <130)

## 2015-10-12 ENCOUNTER — Encounter: Payer: Self-pay | Admitting: Family Medicine

## 2015-10-12 LAB — PSA: PSA: 1.38 ng/mL (ref <=4.00)

## 2015-10-14 ENCOUNTER — Encounter: Payer: Self-pay | Admitting: Family Medicine

## 2015-10-14 ENCOUNTER — Ambulatory Visit (INDEPENDENT_AMBULATORY_CARE_PROVIDER_SITE_OTHER): Payer: BLUE CROSS/BLUE SHIELD | Admitting: Family Medicine

## 2015-10-14 VITALS — BP 122/74 | HR 80 | Temp 98.6°F | Resp 16 | Ht 71.0 in | Wt 282.0 lb

## 2015-10-14 DIAGNOSIS — Z0000 Encounter for general adult medical examination without abnormal findings: Secondary | ICD-10-CM | POA: Diagnosis not present

## 2015-10-14 DIAGNOSIS — Z1159 Encounter for screening for other viral diseases: Secondary | ICD-10-CM

## 2015-10-14 DIAGNOSIS — E038 Other specified hypothyroidism: Secondary | ICD-10-CM | POA: Diagnosis not present

## 2015-10-14 LAB — LIPID PANEL
CHOLESTEROL: 191 mg/dL (ref 125–200)
HDL: 45 mg/dL (ref >=40)
LDL Cholesterol: 114 mg/dL (ref <130)
TRIGLYCERIDES: 160 mg/dL — AB (ref <150)
Total CHOL/HDL Ratio: 4.2 Ratio (ref <=5.0)
VLDL: 32 mg/dL — ABNORMAL HIGH (ref <30)

## 2015-10-14 NOTE — Progress Notes (Signed)
Subjective:    Patient ID: Juan Stone, male    DOB: 13-Jan-1961, 55 y.o.   MRN: [id number]  HPI   patient is a 55 year old white male today for complete physical exam. I had a difficult time managing his hypothyroidism. His dosage of levothyroxine has changed frequently. In the past we have often had to increase it only to decrease it later. The last time we checked with the dose was excellent and his TSH was within therapeutic limits. However recently it has risen to over 23. Patient states there's been no change in manufacturer. He does admit that he takes the medication sometimes with food and sometimes on an empty stomach. He also takes it with other medications which may interfere with absorption. He denies any symptoms of hypothyroidism. His colonoscopy is due at the end of this year. His prostate exam is due today. His immunizations are up-to-date. His most recent lab work as listed below Lab on 10/11/2015  Component Date Value Ref Range Status  . PSA 10/11/2015 1.38  <=4.00 ng/mL Final   Comment: Test Methodology: ECLIA PSA (Electrochemiluminescence Immunoassay)   For PSA values from 2.5-4.0, particularly in younger men <36 years old, the AUA and NCCN suggest testing for % Free PSA (3515) and evaluation of the rate of increase in PSA (PSA velocity).   . WBC 10/11/2015 5.4  4.0 - 10.5 K/uL Final  . RBC 10/11/2015 5.07  4.22 - 5.81 MIL/uL Final  . Hemoglobin 10/11/2015 15.8  13.0 - 17.0 g/dL Final  . HCT 10/11/2015 46.5  39.0 - 52.0 % Final  . MCV 10/11/2015 91.7  78.0 - 100.0 fL Final  . MCH 10/11/2015 31.2  26.0 - 34.0 pg Final  . MCHC 10/11/2015 34.0  30.0 - 36.0 g/dL Final  . RDW 10/11/2015 13.3  11.5 - 15.5 % Final  . Platelets 10/11/2015 269  150 - 400 K/uL Final  . MPV 10/11/2015 9.4  8.6 - 12.4 fL Final  . Neutrophils Relative % 10/11/2015 61  43 - 77 % Final  . Neutro Abs 10/11/2015 3.3  1.7 - 7.7 K/uL Final  . Lymphocytes Relative 10/11/2015 26  12 - 46 % Final  .  Lymphs Abs 10/11/2015 1.4  0.7 - 4.0 K/uL Final  . Monocytes Relative 10/11/2015 10  3 - 12 % Final  . Monocytes Absolute 10/11/2015 0.5  0.1 - 1.0 K/uL Final  . Eosinophils Relative 10/11/2015 2  0 - 5 % Final  . Eosinophils Absolute 10/11/2015 0.1  0.0 - 0.7 K/uL Final  . Basophils Relative 10/11/2015 1  0 - 1 % Final  . Basophils Absolute 10/11/2015 0.1  0.0 - 0.1 K/uL Final  . Smear Review 10/11/2015 Criteria for review not met   Final  . Sodium 10/11/2015 139  135 - 146 mmol/L Final  . Potassium 10/11/2015 5.1  3.5 - 5.3 mmol/L Final  . Chloride 10/11/2015 100  98 - 110 mmol/L Final  . CO2 10/11/2015 29  20 - 31 mmol/L Final  . Glucose, Bld 10/11/2015 88  70 - 99 mg/dL Final  . BUN 10/11/2015 13  7 - 25 mg/dL Final  . Creat 10/11/2015 0.95  0.70 - 1.33 mg/dL Final  . Total Bilirubin 10/11/2015 0.6  0.2 - 1.2 mg/dL Final  . Alkaline Phosphatase 10/11/2015 69  40 - 115 U/L Final  . AST 10/11/2015 21  10 - 35 U/L Final  . ALT 10/11/2015 21  9 - 46 U/L Final  . Total Protein  10/11/2015 7.1  6.1 - 8.1 g/dL Final  . Albumin 10/11/2015 4.5  3.6 - 5.1 g/dL Final  . Calcium 10/11/2015 9.8  8.6 - 10.3 mg/dL Final  . GFR, Est African American 10/11/2015 >89  >=60 mL/min Final  . GFR, Est Non African American 10/11/2015 88  >=60 mL/min Final   Comment:   The estimated GFR is a calculation valid for adults (>=57 years old) that uses the CKD-EPI algorithm to adjust for age and sex. It is   not to be used for children, pregnant women, hospitalized patients,    patients on dialysis, or with rapidly changing kidney function. According to the NKDEP, eGFR >89 is normal, 60-89 shows mild impairment, 30-59 shows moderate impairment, 15-29 shows severe impairment and <15 is ESRD.     Marland Kitchen Direct LDL 10/11/2015 118  <130 mg/dL Final   Comment:   Desirable range <100 mg/dL for patients with CHD or diabetes and <70 mg/dL for diabetic patients with known heart disease.     Marland Kitchen TSH 10/11/2015 23.501*  0.350 - 4.500 uIU/mL Final  Orders Only on 10/11/2015  Component Date Value Ref Range Status  . Cholesterol 10/11/2015 191  125 - 200 mg/dL Final  . Triglycerides 10/11/2015 160* <150 mg/dL Final  . HDL 10/11/2015 45  >=40 mg/dL Final  . Total CHOL/HDL Ratio 10/11/2015 4.2  <=5.0 Ratio Final  . VLDL 10/11/2015 32* <30 mg/dL Final  . LDL Cholesterol 10/11/2015 114  <130 mg/dL Final   Comment:   Total Cholesterol/HDL Ratio:CHD Risk                        Coronary Heart Disease Risk Table                                        Men       Women          1/2 Average Risk              3.4        3.3              Average Risk              5.0        4.4           2X Average Risk              9.6        7.1           3X Average Risk             23.4       11.0 Use the calculated Patient Ratio above and the CHD Risk table  to determine the patient's CHD Risk.    Past Medical History  Diagnosis Date  . Hyperlipidemia   . Thyroid disease     hypothyroidism  . Obesity    Past Surgical History  Procedure Laterality Date  . Corneal transplant      Fuch's dystorphy (bilateral)   Current Outpatient Prescriptions on File Prior to Visit  Medication Sig Dispense Refill  . amoxicillin-clavulanate (AUGMENTIN) 875-125 MG tablet Take 1 tablet by mouth 2 (two) times daily. 20 tablet 0  . clotrimazole-betamethasone (LOTRISONE) cream APPLY TO AFFECTED AREA TWICE A DAY 30 g 1  . fluticasone (FLONASE) 50 MCG/ACT nasal spray Place  2 sprays into both nostrils daily. 16 g 11  . Ginkgo Biloba 40 MG TABS Take 1 tablet by mouth.    . levothyroxine (SYNTHROID, LEVOTHROID) 175 MCG tablet Take 1 tablet (175 mcg total) by mouth daily before breakfast. 90 tablet 3  . Multiple Vitamins tablet Take 1 tablet by mouth.    . prednisoLONE acetate (PRED FORTE) 1 % ophthalmic suspension Place 1 drop into the right eye 2 (two) times daily.    . simvastatin (ZOCOR) 20 MG tablet Take 1 tablet (20 mg total) by mouth at  bedtime. 90 tablet 3   No current facility-administered medications on file prior to visit.   No Known Allergies Social History   Social History  . Marital Status: Married    Spouse Name: N/A  . Number of Children: N/A  . Years of Education: N/A   Occupational History  . Not on file.   Social History Main Topics  . Smoking status: Never Smoker   . Smokeless tobacco: Never Used  . Alcohol Use: No     Comment: Ocassional  . Drug Use: No  . Sexual Activity: Not on file   Other Topics Concern  . Not on file   Social History Narrative     Review of Systems  All other systems reviewed and are negative.      Objective:   Physical Exam  Constitutional: He is oriented to person, place, and time. He appears well-developed and well-nourished. No distress.  HENT:  Head: Normocephalic and atraumatic.  Right Ear: External ear normal.  Left Ear: External ear normal.  Nose: Nose normal.  Mouth/Throat: Oropharynx is clear and moist. No oropharyngeal exudate.  Eyes: Conjunctivae and EOM are normal. Pupils are equal, round, and reactive to light. Right eye exhibits no discharge. Left eye exhibits no discharge. No scleral icterus.  Neck: Normal range of motion. Neck supple. No JVD present. No tracheal deviation present. No thyromegaly present.  Cardiovascular: Normal rate, regular rhythm and normal heart sounds.  Exam reveals no gallop and no friction rub.   No murmur heard. Pulmonary/Chest: Effort normal and breath sounds normal. No stridor. No respiratory distress. He has no wheezes. He has no rales. He exhibits no tenderness.  Abdominal: Soft. Bowel sounds are normal. He exhibits no distension and no mass. There is no tenderness. There is no rebound and no guarding.  Genitourinary: Rectum normal and prostate normal.  Musculoskeletal: Normal range of motion. He exhibits no edema or tenderness.  Lymphadenopathy:    He has no cervical adenopathy.  Neurological: He is alert and  oriented to person, place, and time. He has normal reflexes. He displays normal reflexes. No cranial nerve deficit. He exhibits normal muscle tone. Coordination normal.  Skin: Skin is warm. No rash noted. He is not diaphoretic. No erythema. No pallor.  Psychiatric: He has a normal mood and affect. His behavior is normal. Judgment and thought content normal.  Vitals reviewed.         Assessment & Plan:  Other specified hypothyroidism - Plan: Hepatitis C Ab Reflex HCV RNA, QUANT, T4, free, T3, Free  Need for hepatitis C screening test - Plan: Hepatitis C Ab Reflex HCV RNA, QUANT  Routine general medical examination at a health care facility   I will screen the patient for hepatitis C. He will schedule his colonoscopy later this year. The remainder of his cancer screening is up-to-date in the remainder of his lab work is normal. I will check a free T3 and  a free T4. If his T3 level is low, the patient may benefit from Cytomel. If his T3 level is normal, I will switch the patient to Synthroid so that the dose can be more consistent and keep him on 175 g by mouth daily. I recommended that he take the medication on the empty stomach with no other medicines. Recheck levels in 8 weeks and make dosage changes at that time.

## 2015-10-15 ENCOUNTER — Encounter: Payer: Self-pay | Admitting: Family Medicine

## 2015-10-15 LAB — T3, FREE: T3 FREE: 2.6 pg/mL (ref 2.3–4.2)

## 2015-10-15 LAB — T4, FREE: FREE T4: 0.99 ng/dL (ref 0.80–1.80)

## 2015-10-15 LAB — HEPATITIS C ANTIBODY: HCV Ab: NEGATIVE

## 2015-10-18 ENCOUNTER — Other Ambulatory Visit: Payer: Self-pay | Admitting: Family Medicine

## 2015-10-18 DIAGNOSIS — E038 Other specified hypothyroidism: Secondary | ICD-10-CM

## 2015-11-26 ENCOUNTER — Other Ambulatory Visit: Payer: BLUE CROSS/BLUE SHIELD

## 2015-11-26 DIAGNOSIS — E038 Other specified hypothyroidism: Secondary | ICD-10-CM

## 2015-11-26 LAB — T4, FREE: FREE T4: 1.4 ng/dL (ref 0.8–1.8)

## 2015-11-26 LAB — TSH: TSH: 0.35 m[IU]/L — AB (ref 0.40–4.50)

## 2015-11-30 ENCOUNTER — Encounter: Payer: Self-pay | Admitting: Family Medicine

## 2015-12-09 ENCOUNTER — Encounter: Payer: Self-pay | Admitting: Family Medicine

## 2015-12-09 ENCOUNTER — Telehealth: Payer: Self-pay | Admitting: *Deleted

## 2015-12-09 NOTE — Telephone Encounter (Signed)
Pt called stating he had his Thyroid levels checked and was still slightly below normal, wants to know if you think he should go to a higher dose of his thyroid medication levothyroxine , says this is the second time he has had it rechecked and still low.  Pt would like new script called into [facility]

## 2015-12-10 MED ORDER — LEVOTHYROXINE SODIUM 175 MCG PO TABS: 175.0000 ug | ORAL_TABLET | Freq: Every day | ORAL | Status: DC

## 2015-12-10 NOTE — Telephone Encounter (Signed)
Medication called/sent to requested pharmacy  

## 2015-12-10 NOTE — Telephone Encounter (Signed)
TSH is slightly low at 0.35.  But this means his levothyroxine dose is actually slightly high.  We would actually need to decrease levothyroxine.  I would leave the dose alone.  He went from low to high and I feel we would be "chasing our tail" if we change.  Normal is 0.45 and he is so close I would recheck it in 6 months.

## 2015-12-10 NOTE — Telephone Encounter (Signed)
Patient aware via mychart

## 2015-12-30 DIAGNOSIS — M5416 Radiculopathy, lumbar region: Secondary | ICD-10-CM | POA: Diagnosis not present

## 2016-01-17 DIAGNOSIS — M5416 Radiculopathy, lumbar region: Secondary | ICD-10-CM | POA: Diagnosis not present

## 2016-01-18 ENCOUNTER — Encounter: Payer: Self-pay | Admitting: Family Medicine

## 2016-01-19 MED ORDER — SIMVASTATIN 20 MG PO TABS: 20.0000 mg | ORAL_TABLET | Freq: Every day | ORAL | Status: DC

## 2016-01-19 NOTE — Telephone Encounter (Signed)
Medication called/sent to requested pharmacy  

## 2016-01-24 DIAGNOSIS — D1801 Hemangioma of skin and subcutaneous tissue: Secondary | ICD-10-CM | POA: Diagnosis not present

## 2016-01-24 DIAGNOSIS — L814 Other melanin hyperpigmentation: Secondary | ICD-10-CM | POA: Diagnosis not present

## 2016-01-24 DIAGNOSIS — L821 Other seborrheic keratosis: Secondary | ICD-10-CM | POA: Diagnosis not present

## 2016-01-26 ENCOUNTER — Ambulatory Visit (INDEPENDENT_AMBULATORY_CARE_PROVIDER_SITE_OTHER): Payer: BLUE CROSS/BLUE SHIELD | Admitting: Physician Assistant

## 2016-01-26 ENCOUNTER — Encounter: Payer: Self-pay | Admitting: Physician Assistant

## 2016-01-26 VITALS — BP 114/78 | HR 56 | Temp 97.8°F | Resp 18 | Wt 265.0 lb

## 2016-01-26 DIAGNOSIS — R5381 Other malaise: Secondary | ICD-10-CM | POA: Diagnosis not present

## 2016-01-26 DIAGNOSIS — M791 Myalgia: Secondary | ICD-10-CM | POA: Diagnosis not present

## 2016-01-26 DIAGNOSIS — M609 Myositis, unspecified: Secondary | ICD-10-CM | POA: Diagnosis not present

## 2016-01-26 DIAGNOSIS — W57XXXA Bitten or stung by nonvenomous insect and other nonvenomous arthropods, initial encounter: Secondary | ICD-10-CM

## 2016-01-26 DIAGNOSIS — IMO0001 Reserved for inherently not codable concepts without codable children: Secondary | ICD-10-CM

## 2016-01-26 MED ORDER — DOXYCYCLINE HYCLATE 100 MG PO TABS: 100.0000 mg | ORAL_TABLET | Freq: Two times a day (BID) | ORAL | Status: DC

## 2016-01-26 NOTE — Progress Notes (Signed)
Patient ID: Juan Stone MRN: [id number], DOB: January 09, 1961, 55 y.o. Date of Encounter: 01/26/2016, 10:44 AM    Chief Complaint:  Chief Complaint  Patient presents with  . tick bite, not feeling well    concerned had RMSF last year, had left over doxycycline from last year so started taking it     HPI: 55 y.o. year old white male presents with above.   Says he found tic on left lower abdomen on Sunday night (01/23/2016).  Says area around it started to swell---area diameter was ~ 5 inches--the area of swelling.  Says then swelling decreased---diamter of swelling now bout 3 inches.  Has seen no other area of rash.   Yesterday "did not feel good". Having muscles soreness. Few chills. No nausea. No known fever. No headache.   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 Says that with that time--"by the time he went to the doctor, he was really sick" Didn't want to fool around this time.  As soon as he developed symptoms, took Doxycycline that he had left from prior Rx.      Home Meds:   Outpatient Prescriptions Prior to Visit  Medication Sig Dispense Refill  . fluticasone (FLONASE) 50 MCG/ACT nasal spray Place 2 sprays into both nostrils daily. 16 g 11  . levothyroxine (SYNTHROID, LEVOTHROID) 175 MCG tablet Take 1 tablet (175 mcg total) by mouth daily before breakfast. 90 tablet 3  . Multiple Vitamins tablet Take 1 tablet by mouth.    . prednisoLONE acetate (PRED FORTE) 1 % ophthalmic suspension Place 1 drop into the right eye 2 (two) times daily.    . simvastatin (ZOCOR) 20 MG tablet Take 1 tablet (20 mg total) by mouth at bedtime. 90 tablet 3  . Ginkgo Biloba 40 MG TABS Take 1 tablet by mouth. Reported on 01/26/2016    . amoxicillin-clavulanate (AUGMENTIN) 875-125 MG tablet Take 1 tablet by mouth 2 (two) times daily. 20 tablet 0  . clotrimazole-betamethasone (LOTRISONE) cream APPLY TO AFFECTED AREA TWICE A DAY 30 g 1   No facility-administered medications prior to visit.    Allergies: No  Known Allergies    Review of Systems: See HPI for pertinent ROS. All other ROS negative.    Physical Exam: Blood pressure 114/78, pulse 56, temperature 97.8 F (36.6 C), temperature source Oral, resp. rate 18, weight 265 lb (120.203 kg)., Body mass index is 36.98 kg/(m^2). General:  Obese WM. Appears in no acute distress. Neck: Supple. No thyromegaly. No lymphadenopathy. Lungs: Clear bilaterally to auscultation without wheezes, rales, or rhonchi. Breathing is unlabored. Heart: Regular rhythm. No murmurs, rubs, or gallops. Abdomen: Soft, non-tender, non-distended with normoactive bowel sounds. No hepatomegaly. No rebound/guarding. No obvious abdominal masses. Msk:  Strength and tone normal for age. Skin: Left lower abdomen: There is a 1 cm x 0.5cm area with dark scab. There is 1/2 inch x 1 inch diameter area of urticaria surrounding this  Neuro: Alert and oriented X 3. Moves all extremities spontaneously. Gait is normal. CNII-XII grossly in tact. Psych:  Responds to questions appropriately with a normal affect.     ASSESSMENT AND PLAN:  55 y.o. year old male with  1. Tick bite Discussed doing lab for titers for Lyme, RMSF. Decided that he will take med regardless of results so opted to not do lab. He is to start Doxy immeditaley, take as directed, and complete for 21 days. F/U if symptoms worsen or do not resolve.  - doxycycline (VIBRA-TABS) 100 MG tablet; Take  1 tablet (100 mg total) by mouth 2 (two) times daily.  Dispense: 42 tablet; Refill: 0  2. Myalgia and myositis - doxycycline (VIBRA-TABS) 100 MG tablet; Take 1 tablet (100 mg total) by mouth 2 (two) times daily.  Dispense: 42 tablet; Refill: 0  3. Malaise - doxycycline (VIBRA-TABS) 100 MG tablet; Take 1 tablet (100 mg total) by mouth 2 (two) times daily.  Dispense: 42 tablet; Refill: 0   Signed, [address]Mary Beth AnsonDixon, [place]PA, [facility] 01/26/2016 10:44 AM

## 2016-01-30 ENCOUNTER — Encounter: Payer: Self-pay | Admitting: Family Medicine

## 2016-01-31 MED ORDER — FLUTICASONE PROPIONATE 50 MCG/ACT NA SUSP: 2.0000 | Freq: Every day | NASAL | Status: AC | PRN

## 2016-01-31 MED ORDER — FLUOROMETHOLONE 0.1 % OP SUSP: 1.0000 [drp] | Freq: Every day | OPHTHALMIC | Status: DC

## 2016-01-31 NOTE — Telephone Encounter (Signed)
Medication updated as per pt's request

## 2016-02-10 ENCOUNTER — Ambulatory Visit (INDEPENDENT_AMBULATORY_CARE_PROVIDER_SITE_OTHER): Payer: BLUE CROSS/BLUE SHIELD | Admitting: Family Medicine

## 2016-02-10 ENCOUNTER — Encounter: Payer: Self-pay | Admitting: Family Medicine

## 2016-02-10 VITALS — BP 118/62 | HR 58 | Temp 98.4°F | Resp 18 | Ht 71.0 in | Wt 260.0 lb

## 2016-02-10 DIAGNOSIS — M545 Low back pain, unspecified: Secondary | ICD-10-CM

## 2016-02-10 LAB — URINALYSIS, ROUTINE W REFLEX MICROSCOPIC
Bilirubin Urine: NEGATIVE
Glucose, UA: NEGATIVE
Hgb urine dipstick: NEGATIVE
KETONES UR: NEGATIVE
Leukocytes, UA: NEGATIVE
Nitrite: NEGATIVE
PH: 6.5 (ref 5.0–8.0)
Protein, ur: NEGATIVE
SPECIFIC GRAVITY, URINE: 1.02 (ref 1.001–1.035)

## 2016-02-10 NOTE — Progress Notes (Signed)
Subjective:    Patient ID: Juan Stone, male    DOB: 04-16-1961, 55 y.o.   MRN: [id number]  HPI Patient is on doxycycline for tick bite. Over the last 10 days, he is developed pain in his right flank. The pain is sharp and tends to radiate around in a dermatomal pattern. It does not radiate into his testicles but more around the rib. There is no shingles like rash. He denies any nausea or vomiting. He denies any dysuria, hematuria. He does report strong odor to his urine and changing color. Pain is also reproduced slightly with twisting and movement. However he states that it does not feel like his normal low back strain. Office Visit on 02/10/2016  Component Date Value Ref Range Status  . Color, Urine 02/10/2016 YELLOW  YELLOW Final   Comment: ** Please note change in unit of measure and reference range(s). **     . APPearance 02/10/2016 CLEAR  CLEAR Final  . Specific Gravity, Urine 02/10/2016 1.020  1.001 - 1.035 Final  . pH 02/10/2016 6.5  5.0 - 8.0 Final  . Glucose, UA 02/10/2016 NEGATIVE  NEGATIVE Final  . Bilirubin Urine 02/10/2016 NEGATIVE  NEGATIVE Final  . Ketones, ur 02/10/2016 NEGATIVE  NEGATIVE Final  . Hgb urine dipstick 02/10/2016 NEGATIVE  NEGATIVE Final  . Protein, ur 02/10/2016 NEGATIVE  NEGATIVE Final  . Nitrite 02/10/2016 NEGATIVE  NEGATIVE Final  . Leukocytes, UA 02/10/2016 NEGATIVE  NEGATIVE Final    Past Medical History  Diagnosis Date  . Hyperlipidemia   . Thyroid disease     hypothyroidism  . Obesity    Past Surgical History  Procedure Laterality Date  . Corneal transplant      Fuch's dystorphy (bilateral)   Current Outpatient Prescriptions on File Prior to Visit  Medication Sig Dispense Refill  . doxycycline (VIBRA-TABS) 100 MG tablet Take 1 tablet (100 mg total) by mouth 2 (two) times daily. 42 tablet 0  . fluorometholone (FML) 0.1 % ophthalmic suspension Place 1 drop into both eyes daily. 5 mL 0  . fluticasone (FLONASE) 50 MCG/ACT nasal spray  Place 2 sprays into both nostrils daily as needed for allergies or rhinitis. 16 g 11  . gabapentin (NEURONTIN) 300 MG capsule Take 300 mg by mouth at bedtime.  1  . levothyroxine (SYNTHROID, LEVOTHROID) 175 MCG tablet Take 1 tablet (175 mcg total) by mouth daily before breakfast. 90 tablet 3  . Multiple Vitamins tablet Take 1 tablet by mouth.    . simvastatin (ZOCOR) 20 MG tablet Take 1 tablet (20 mg total) by mouth at bedtime. 90 tablet 3   No current facility-administered medications on file prior to visit.   No Known Allergies Social History   Social History  . Marital Status: Married    Spouse Name: N/A  . Number of Children: N/A  . Years of Education: N/A   Occupational History  . Not on file.   Social History Main Topics  . Smoking status: Never Smoker   . Smokeless tobacco: Never Used  . Alcohol Use: No     Comment: Ocassional  . Drug Use: No  . Sexual Activity: Not on file   Other Topics Concern  . Not on file   Social History Narrative      Review of Systems  All other systems reviewed and are negative.      Objective:   Physical Exam  Constitutional: He appears well-developed and well-nourished.  Cardiovascular: Normal rate, regular rhythm and normal heart  sounds.   Pulmonary/Chest: Effort normal and breath sounds normal. No respiratory distress. He has no wheezes. He has no rales.  Abdominal: Soft. Bowel sounds are normal. He exhibits no distension. There is no tenderness. There is no rebound and no guarding.  Musculoskeletal: He exhibits no edema.  Vitals reviewed.         Assessment & Plan:  Right-sided low back pain without sciatica - Plan: Urinalysis, Routine w reflex microscopic (not at [facility]) Urinalysis is completely normal. Given the absence of symptoms of an infection and a normal urinalysis I do not believe this is infectious in origin. Although the pain does radiate slightly I do not believe is a kidney stone because I do not believe he  will have a kidney stone for 10 days and not be in severe pain or have hematuria in his urine. I believe this is likely a muscle that he has pulled. Together we decided to monitor for 1 week and should symptoms persist or worsen at that time proceed with a CT scan of the kidneys to evaluate for nephrolithiasis versus obtaining x-rays of the back. Recheck in one week or sooner if worse

## 2016-02-15 ENCOUNTER — Telehealth: Payer: Self-pay | Admitting: Family Medicine

## 2016-02-15 DIAGNOSIS — R109 Unspecified abdominal pain: Secondary | ICD-10-CM

## 2016-02-15 NOTE — Telephone Encounter (Signed)
Yes, please schedule CT of abd and pelvis for kidney stone.

## 2016-02-15 NOTE — Telephone Encounter (Signed)
Pt is still having pain in his side and nausea. He wants to know if Dr. Tanya NonesPickard suggests he have a CT. Please advise [phone]

## 2016-02-16 NOTE — Telephone Encounter (Signed)
Order placed for CT, pt aware

## 2016-02-23 ENCOUNTER — Ambulatory Visit
Admission: RE | Admit: 2016-02-23 | Discharge: 2016-02-23 | Disposition: A | Discharge status: Home / Self Care | Payer: BLUE CROSS/BLUE SHIELD | Source: Ambulatory Visit | Attending: Family Medicine | Admitting: Family Medicine

## 2016-02-23 DIAGNOSIS — R1110 Vomiting, unspecified: Secondary | ICD-10-CM | POA: Diagnosis not present

## 2016-02-23 DIAGNOSIS — R109 Unspecified abdominal pain: Secondary | ICD-10-CM

## 2016-02-24 ENCOUNTER — Telehealth: Payer: Self-pay | Admitting: Family Medicine

## 2016-02-24 ENCOUNTER — Encounter: Payer: Self-pay | Admitting: Family Medicine

## 2016-02-24 ENCOUNTER — Inpatient Hospital Stay: Admission: RE | Admit: 2016-02-24 | Payer: Managed Care, Other (non HMO) | Source: Ambulatory Visit

## 2016-02-24 NOTE — Telephone Encounter (Signed)
MD has not reviewed.   Will call with results once reviewed.

## 2016-02-24 NOTE — Telephone Encounter (Signed)
Patient calling about ct scan results that was done yesterday [phone]

## 2016-02-25 ENCOUNTER — Ambulatory Visit (INDEPENDENT_AMBULATORY_CARE_PROVIDER_SITE_OTHER): Payer: BLUE CROSS/BLUE SHIELD | Admitting: Family Medicine

## 2016-02-25 ENCOUNTER — Encounter: Payer: Self-pay | Admitting: Family Medicine

## 2016-02-25 VITALS — BP 126/74 | HR 82 | Temp 98.1°F | Resp 16 | Ht 71.0 in | Wt 256.0 lb

## 2016-02-25 DIAGNOSIS — E785 Hyperlipidemia, unspecified: Secondary | ICD-10-CM

## 2016-02-25 DIAGNOSIS — M545 Low back pain, unspecified: Secondary | ICD-10-CM

## 2016-02-25 DIAGNOSIS — R1011 Right upper quadrant pain: Principal | ICD-10-CM

## 2016-02-25 DIAGNOSIS — R1010 Upper abdominal pain, unspecified: Secondary | ICD-10-CM | POA: Diagnosis not present

## 2016-02-25 DIAGNOSIS — G8929 Other chronic pain: Secondary | ICD-10-CM

## 2016-02-25 LAB — LIPID PANEL
CHOL/HDL RATIO: 3.6 ratio (ref <=5.0)
CHOLESTEROL: 168 mg/dL (ref 125–200)
HDL: 47 mg/dL (ref >=40)
LDL Cholesterol: 105 mg/dL (ref <130)
TRIGLYCERIDES: 82 mg/dL (ref <150)
VLDL: 16 mg/dL (ref <30)

## 2016-02-25 LAB — CBC WITH DIFFERENTIAL/PLATELET
BASOS ABS: 94 {cells}/uL (ref 0–200)
Basophils Relative: 2 %
Eosinophils Absolute: 705 cells/uL — ABNORMAL HIGH (ref 15–500)
Eosinophils Relative: 15 %
HEMATOCRIT: 43.3 % (ref 38.5–50.0)
HEMOGLOBIN: 14.9 g/dL (ref 13.0–17.0)
LYMPHS ABS: 1269 {cells}/uL (ref 850–3900)
LYMPHS PCT: 27 %
MCH: 31.2 pg (ref 27.0–33.0)
MCHC: 34.4 g/dL (ref 32.0–36.0)
MCV: 90.6 fL (ref 80.0–100.0)
MONO ABS: 517 {cells}/uL (ref 200–950)
MPV: 9.7 fL (ref 7.5–12.5)
Monocytes Relative: 11 %
NEUTROS PCT: 45 %
Neutro Abs: 2115 cells/uL (ref 1500–7800)
Platelets: 245 10*3/uL (ref 140–400)
RBC: 4.78 MIL/uL (ref 4.20–5.80)
RDW: 13.2 % (ref 11.0–15.0)
WBC: 4.7 10*3/uL (ref 3.8–10.8)

## 2016-02-25 LAB — COMPLETE METABOLIC PANEL WITH GFR
ALBUMIN: 4.1 g/dL (ref 3.6–5.1)
ALK PHOS: 56 U/L (ref 40–115)
ALT: 19 U/L (ref 9–46)
AST: 19 U/L (ref 10–35)
BUN: 11 mg/dL (ref 7–25)
CALCIUM: 9.3 mg/dL (ref 8.6–10.3)
CHLORIDE: 106 mmol/L (ref 98–110)
CO2: 26 mmol/L (ref 20–31)
Creat: 0.9 mg/dL (ref 0.70–1.33)
Glucose, Bld: 90 mg/dL (ref 70–99)
POTASSIUM: 5.1 mmol/L (ref 3.5–5.3)
Sodium: 141 mmol/L (ref 135–146)
Total Bilirubin: 0.5 mg/dL (ref 0.2–1.2)
Total Protein: 6.5 g/dL (ref 6.1–8.1)

## 2016-02-25 NOTE — Progress Notes (Signed)
Subjective:    Patient ID: Juan Stone, male    DOB: Feb 09, 1961, 55 y.o.   MRN: [id number]  HPI 02/10/16 Patient is on doxycycline for tick bite. Over the last 10 days, he is developed pain in his right flank. The pain is sharp and tends to radiate around in a dermatomal pattern. It does not radiate into his testicles but more around the rib. There is no shingles like rash. He denies any nausea or vomiting. He denies any dysuria, hematuria. He does report strong odor to his urine and changing color. Pain is also reproduced slightly with twisting and movement. However he states that it does not feel like his normal low back strain.  At that time, my plan was: Urinalysis is completely normal. Given the absence of symptoms of an infection and a normal urinalysis I do not believe this is infectious in origin. Although the pain does radiate slightly I do not believe is a kidney stone because I do not believe he will have a kidney stone for 10 days and not be in severe pain or have hematuria in his urine. I believe this is likely a muscle that he has pulled. Together we decided to monitor for 1 week and should symptoms persist or worsen at that time proceed with a CT scan of the kidneys to evaluate for nephrolithiasis versus obtaining x-rays of the back. Recheck in one week or sooner if worse  02/25/16 After 5 days, the patient's pain persisted.  At that time we proceeded with a CT scan of the abdomen and pelvis to evaluate for possible kidney stone. The results are listed below: 1. No explanation for patient's right-sided back pain. Specifically, no evidence of nephrolithiasis, urinary or enteric obstruction. 2. Moderate DDD of L5 and S1, similar to the 04/14/2014 lumbar spine MRI. 3. Atherosclerosis including coronary artery calcifications.  Patient is here today to discuss. He continues to have dull and constant pain in his right flank. It is located inferior to the scapula is certainly in the rib  area. I believe the pain is too high simply to be referred pain from his degenerative disc disease at L5-S1. Patient does have a history of degenerative disc disease in that area and has received several epidural steroid injections under the care of an orthopedist and is also on gabapentin for that this pain feels different. Furthermore he has persistent nausea associated with the pain. Whenever he eats he becomes extremely nauseated. He denies any blood in his stool. He denies any black tarry stools. He denies any vomiting. There is no evidence of bowel obstruction or constipation. Past Medical History  Diagnosis Date  . Hyperlipidemia   . Thyroid disease     hypothyroidism  . Obesity    Past Surgical History  Procedure Laterality Date  . Corneal transplant      Fuch's dystorphy (bilateral)   Current Outpatient Prescriptions on File Prior to Visit  Medication Sig Dispense Refill  . fluticasone (FLONASE) 50 MCG/ACT nasal spray Place 2 sprays into both nostrils daily as needed for allergies or rhinitis. 16 g 11  . gabapentin (NEURONTIN) 300 MG capsule Take 300 mg by mouth at bedtime.  1  . levothyroxine (SYNTHROID, LEVOTHROID) 175 MCG tablet Take 1 tablet (175 mcg total) by mouth daily before breakfast. 90 tablet 3  . Multiple Vitamins tablet Take 1 tablet by mouth.    . simvastatin (ZOCOR) 20 MG tablet Take 1 tablet (20 mg total) by mouth at bedtime. 90 tablet  3   No current facility-administered medications on file prior to visit.   No Known Allergies Social History   Social History  . Marital Status: Married    Spouse Name: N/A  . Number of Children: N/A  . Years of Education: N/A   Occupational History  . Not on file.   Social History Main Topics  . Smoking status: Never Smoker   . Smokeless tobacco: Never Used  . Alcohol Use: No     Comment: Ocassional  . Drug Use: No  . Sexual Activity: Not on file   Other Topics Concern  . Not on file   Social History Narrative       Review of Systems  All other systems reviewed and are negative.      Objective:   Physical Exam  Constitutional: He appears well-developed and well-nourished.  Cardiovascular: Normal rate, regular rhythm and normal heart sounds.   Pulmonary/Chest: Effort normal and breath sounds normal. No respiratory distress. He has no wheezes. He has no rales.  Abdominal: Soft. Bowel sounds are normal. He exhibits no distension. There is no tenderness. There is no rebound and no guarding.  Musculoskeletal: He exhibits no edema.  Vitals reviewed.         Assessment & Plan:  Abdominal pain, chronic, right upper quadrant - Plan: CBC with Differential/Platelet, COMPLETE METABOLIC PANEL WITH GFR  HLD (hyperlipidemia) - Plan: Lipid panel  Right-sided low back pain without sciatica  While we're drawing lab work, the patient would like to recheck his cholesterol. Given the atherosclerosis seen on his CT scan, my goal LDL cholesterol for this patient would be 70. Therefore I will check a fasting lipid panel. Regarding his pain, it is very atypical. He has no right upper quadrant pain on examination but the pain does seem to radiate around his ribs towards his gallbladder. This is very unusual in the CT scan shows no cholelithiasis. However he does have postprandial nausea and therefore I'm concerned about possible biliary tract disease. It is also possible this could be referred pain from his degenerative disc disease and possible muscular pain. However I cannot explain the nausea related to this. He did complete the doxycycline and it is possible the nausea could be related to the doxycycline however he has been off antibiotics for more than a week and a nausea with meals continues. Therefore went to schedule him for right upper quadrant ultrasound to evaluate for biliary tract disease. Also recommended a consultation with gastroenterologist in case the right upper quadrant ultrasound is normal for  possible EGD evaluation for something like a duodenal ulcer etc. Meanwhile I'll start the patient on a probiotic in case the nausea secondary to the doxycycline.

## 2016-03-01 ENCOUNTER — Encounter: Payer: Self-pay | Admitting: Family Medicine

## 2016-03-03 ENCOUNTER — Encounter: Payer: Self-pay | Admitting: Family Medicine

## 2016-03-03 ENCOUNTER — Ambulatory Visit
Admission: RE | Admit: 2016-03-03 | Discharge: 2016-03-03 | Disposition: A | Discharge status: Home / Self Care | Payer: BLUE CROSS/BLUE SHIELD | Source: Ambulatory Visit | Attending: Family Medicine | Admitting: Family Medicine

## 2016-03-03 DIAGNOSIS — G8929 Other chronic pain: Secondary | ICD-10-CM

## 2016-03-03 DIAGNOSIS — R1011 Right upper quadrant pain: Principal | ICD-10-CM

## 2016-03-03 DIAGNOSIS — K828 Other specified diseases of gallbladder: Secondary | ICD-10-CM | POA: Diagnosis not present

## 2016-03-08 DIAGNOSIS — K811 Chronic cholecystitis: Secondary | ICD-10-CM | POA: Diagnosis not present

## 2016-03-08 DIAGNOSIS — R110 Nausea: Secondary | ICD-10-CM | POA: Diagnosis not present

## 2016-03-08 DIAGNOSIS — R1011 Right upper quadrant pain: Secondary | ICD-10-CM | POA: Diagnosis not present

## 2016-03-08 DIAGNOSIS — K8010 Calculus of gallbladder with chronic cholecystitis without obstruction: Secondary | ICD-10-CM | POA: Diagnosis not present

## 2016-03-09 ENCOUNTER — Encounter: Payer: Self-pay | Admitting: Family Medicine

## 2016-03-09 DIAGNOSIS — G4730 Sleep apnea, unspecified: Secondary | ICD-10-CM | POA: Diagnosis not present

## 2016-03-09 DIAGNOSIS — R001 Bradycardia, unspecified: Secondary | ICD-10-CM | POA: Diagnosis not present

## 2016-03-09 DIAGNOSIS — E039 Hypothyroidism, unspecified: Secondary | ICD-10-CM | POA: Diagnosis not present

## 2016-03-09 DIAGNOSIS — E785 Hyperlipidemia, unspecified: Secondary | ICD-10-CM | POA: Diagnosis not present

## 2016-03-09 DIAGNOSIS — Z961 Presence of intraocular lens: Secondary | ICD-10-CM | POA: Diagnosis not present

## 2016-03-09 DIAGNOSIS — R1011 Right upper quadrant pain: Secondary | ICD-10-CM | POA: Diagnosis not present

## 2016-03-09 DIAGNOSIS — Z6835 Body mass index (BMI) 35.0-35.9, adult: Secondary | ICD-10-CM | POA: Diagnosis not present

## 2016-03-09 DIAGNOSIS — Z9849 Cataract extraction status, unspecified eye: Secondary | ICD-10-CM | POA: Diagnosis not present

## 2016-03-09 DIAGNOSIS — K811 Chronic cholecystitis: Secondary | ICD-10-CM | POA: Diagnosis not present

## 2016-03-09 DIAGNOSIS — Z9889 Other specified postprocedural states: Secondary | ICD-10-CM | POA: Diagnosis not present

## 2016-03-09 DIAGNOSIS — K819 Cholecystitis, unspecified: Secondary | ICD-10-CM | POA: Diagnosis not present

## 2016-03-09 DIAGNOSIS — Z7982 Long term (current) use of aspirin: Secondary | ICD-10-CM | POA: Diagnosis not present

## 2016-03-09 DIAGNOSIS — E669 Obesity, unspecified: Secondary | ICD-10-CM | POA: Diagnosis not present

## 2016-03-09 DIAGNOSIS — Z79899 Other long term (current) drug therapy: Secondary | ICD-10-CM | POA: Diagnosis not present

## 2016-03-09 DIAGNOSIS — R109 Unspecified abdominal pain: Secondary | ICD-10-CM | POA: Diagnosis not present

## 2016-03-13 DIAGNOSIS — Z947 Corneal transplant status: Secondary | ICD-10-CM | POA: Diagnosis not present

## 2016-03-13 DIAGNOSIS — Z961 Presence of intraocular lens: Secondary | ICD-10-CM | POA: Diagnosis not present

## 2016-05-19 NOTE — Telephone Encounter (Signed)
Patient aware via mychart

## 2016-07-03 DIAGNOSIS — Z1211 Encounter for screening for malignant neoplasm of colon: Secondary | ICD-10-CM | POA: Diagnosis not present

## 2016-07-06 DIAGNOSIS — G4733 Obstructive sleep apnea (adult) (pediatric): Secondary | ICD-10-CM | POA: Diagnosis not present

## 2016-07-13 ENCOUNTER — Other Ambulatory Visit: Payer: Self-pay | Admitting: Family Medicine

## 2016-07-24 ENCOUNTER — Ambulatory Visit (INDEPENDENT_AMBULATORY_CARE_PROVIDER_SITE_OTHER): Payer: BLUE CROSS/BLUE SHIELD | Admitting: Physician Assistant

## 2016-07-24 ENCOUNTER — Encounter: Payer: Self-pay | Admitting: Physician Assistant

## 2016-07-24 VITALS — BP 110/82 | HR 52 | Temp 98.3°F | Resp 18 | Wt 239.0 lb

## 2016-07-24 DIAGNOSIS — R8290 Unspecified abnormal findings in urine: Secondary | ICD-10-CM

## 2016-07-24 LAB — URINALYSIS, ROUTINE W REFLEX MICROSCOPIC
Bilirubin Urine: NEGATIVE
Glucose, UA: NEGATIVE
HGB URINE DIPSTICK: NEGATIVE
KETONES UR: NEGATIVE
Leukocytes, UA: NEGATIVE
NITRITE: NEGATIVE
PROTEIN: NEGATIVE
SPECIFIC GRAVITY, URINE: 1.01 (ref 1.001–1.035)
pH: 6.5 (ref 5.0–8.0)

## 2016-07-24 NOTE — Progress Notes (Signed)
Patient ID: Sofie HartiganRobert Code MRN: [id number], DOB: [birth year], 55 y.o. Date of Encounter: 07/24/2016, 12:33 PM    Chief Complaint:  Chief Complaint  Patient presents with  . urine frequency    urine odor  . right ear clogged     HPI: 55 y.o. year old male presents with above.   Says that his urine is usually really clear and for the last couple of weeks it has just looked a little bit "yellower "and has seemed to have a little more smell " --says that he wanted to come and get it checked while he is in town. Has had no burning with urination. Has had no fevers or chills.  Also thought that while he was here he would get me to check his ears. Says that he has had problems with fluid behind his years in the past and just wants to have them looked at while he is here. Has had no ear ache and no increased amount of nasal congestion or mucus from the nose. No fevers or chills.     Home Meds:   Outpatient Medications Prior to Visit  Medication Sig Dispense Refill  . clotrimazole-betamethasone (LOTRISONE) cream APPLY TO AFFECTED AREA TWICE A DAY 30 g 0  . fluorometholone (FML) 0.1 % ophthalmic suspension 1 drop every 4 (four) hours.    . fluticasone (FLONASE) 50 MCG/ACT nasal spray Place 2 sprays into both nostrils daily as needed for allergies or rhinitis. 16 g 11  . levothyroxine (SYNTHROID, LEVOTHROID) 175 MCG tablet Take 1 tablet (175 mcg total) by mouth daily before breakfast. 90 tablet 3  . Multiple Vitamins tablet Take 1 tablet by mouth.    . simvastatin (ZOCOR) 20 MG tablet Take 1 tablet (20 mg total) by mouth at bedtime. 90 tablet 3  . gabapentin (NEURONTIN) 300 MG capsule Take 300 mg by mouth at bedtime.  1   No facility-administered medications prior to visit.     Allergies: No Known Allergies    Review of Systems: See HPI for pertinent ROS. All other ROS negative.    Physical Exam: Blood pressure 110/82, pulse (!) 52, temperature 98.3 F (36.8 C), temperature source  Oral, resp. rate 18, weight 239 lb (108.4 kg), SpO2 98 %., Body mass index is 33.33 kg/m. General:  WM. Appears in no acute distress. HEENT: Normocephalic, atraumatic, eyes without discharge, sclera non-icteric, nares are without discharge. Bilateral auditory canals clear, TM's are without perforation. Bilateral TMs have some streaks of inflammation down the center but otherwise TMs are clear, with no effusion or sign of fluid or infection. Oral cavity moist, posterior pharynx without exudate, erythema, peritonsillar abscess. Neck: Supple. No thyromegaly. No lymphadenopathy. Lungs: Clear bilaterally to auscultation without wheezes, rales, or rhonchi. Breathing is unlabored. Heart: Regular rhythm. No murmurs, rubs, or gallops. Msk:  Strength and tone normal for age. Extremities/Skin: Warm and dry.  Neuro: Alert and oriented X 3. Moves all extremities spontaneously. Gait is normal. CNII-XII grossly in tact. Psych:  Responds to questions appropriately with a normal affect.   Results for orders placed or performed in visit on 07/24/16  Urinalysis, Routine w reflex microscopic (not at [facility])  Result Value Ref Range   Color, Urine YELLOW YELLOW   APPearance CLEAR CLEAR   Specific Gravity, Urine 1.010 1.001 - 1.035   pH 6.5 5.0 - 8.0   Glucose, UA NEGATIVE NEGATIVE   Bilirubin Urine NEGATIVE NEGATIVE   Ketones, ur NEGATIVE NEGATIVE   Hgb urine dipstick NEGATIVE NEGATIVE  Protein, ur NEGATIVE NEGATIVE   Nitrite NEGATIVE NEGATIVE   Leukocytes, UA NEGATIVE NEGATIVE     ASSESSMENT AND PLAN:  55 y.o. year old male with  1. Abnormal urine odor - Urinalysis, Routine w reflex microscopic (not at [facility]) Reassured him that urinalysis shows no abnormality.  2. Reassured him that ear exam appears stable with no signs of fluid or infection.  [address]igned, Shalina Norfolk Beth UnderwoodDixon, [place], [facility] 07/24/2016 12:33 PM

## 2016-08-03 DIAGNOSIS — K635 Polyp of colon: Secondary | ICD-10-CM | POA: Diagnosis not present

## 2016-08-03 DIAGNOSIS — Z1211 Encounter for screening for malignant neoplasm of colon: Secondary | ICD-10-CM | POA: Diagnosis not present

## 2016-08-03 DIAGNOSIS — D123 Benign neoplasm of transverse colon: Secondary | ICD-10-CM | POA: Diagnosis not present

## 2016-08-18 ENCOUNTER — Encounter: Payer: Self-pay | Admitting: Family Medicine

## 2016-08-18 ENCOUNTER — Other Ambulatory Visit: Payer: Self-pay | Admitting: *Deleted

## 2016-08-18 DIAGNOSIS — E038 Other specified hypothyroidism: Secondary | ICD-10-CM

## 2016-08-18 DIAGNOSIS — E785 Hyperlipidemia, unspecified: Secondary | ICD-10-CM

## 2016-08-23 ENCOUNTER — Other Ambulatory Visit: Payer: BLUE CROSS/BLUE SHIELD

## 2016-08-23 DIAGNOSIS — E038 Other specified hypothyroidism: Secondary | ICD-10-CM | POA: Diagnosis not present

## 2016-08-23 DIAGNOSIS — E785 Hyperlipidemia, unspecified: Secondary | ICD-10-CM | POA: Diagnosis not present

## 2016-08-23 LAB — CBC WITH DIFFERENTIAL/PLATELET
BASOS ABS: 51 {cells}/uL (ref 0–200)
Basophils Relative: 1 %
EOS ABS: 153 {cells}/uL (ref 15–500)
Eosinophils Relative: 3 %
HEMATOCRIT: 45.5 % (ref 38.5–50.0)
Hemoglobin: 15.2 g/dL (ref 13.0–17.0)
LYMPHS PCT: 25 %
Lymphs Abs: 1275 cells/uL (ref 850–3900)
MCH: 31.3 pg (ref 27.0–33.0)
MCHC: 33.4 g/dL (ref 32.0–36.0)
MCV: 93.6 fL (ref 80.0–100.0)
MONO ABS: 459 {cells}/uL (ref 200–950)
MONOS PCT: 9 %
MPV: 9.3 fL (ref 7.5–12.5)
NEUTROS PCT: 62 %
Neutro Abs: 3162 cells/uL (ref 1500–7800)
PLATELETS: 227 10*3/uL (ref 140–400)
RBC: 4.86 MIL/uL (ref 4.20–5.80)
RDW: 13 % (ref 11.0–15.0)
WBC: 5.1 10*3/uL (ref 3.8–10.8)

## 2016-08-23 LAB — COMPLETE METABOLIC PANEL WITH GFR
ALT: 19 U/L (ref 9–46)
AST: 22 U/L (ref 10–35)
Albumin: 4.1 g/dL (ref 3.6–5.1)
Alkaline Phosphatase: 62 U/L (ref 40–115)
BILIRUBIN TOTAL: 0.6 mg/dL (ref 0.2–1.2)
BUN: 12 mg/dL (ref 7–25)
CALCIUM: 9.2 mg/dL (ref 8.6–10.3)
CHLORIDE: 102 mmol/L (ref 98–110)
CO2: 26 mmol/L (ref 20–31)
CREATININE: 0.98 mg/dL (ref 0.70–1.33)
GFR, EST NON AFRICAN AMERICAN: 85 mL/min (ref >=60)
Glucose, Bld: 89 mg/dL (ref 70–99)
Potassium: 5 mmol/L (ref 3.5–5.3)
Sodium: 139 mmol/L (ref 135–146)
Total Protein: 6.5 g/dL (ref 6.1–8.1)

## 2016-08-23 LAB — LIPID PANEL
CHOLESTEROL: 159 mg/dL (ref <200)
HDL: 57 mg/dL (ref >40)
LDL CALC: 83 mg/dL (ref <100)
TRIGLYCERIDES: 97 mg/dL (ref <150)
Total CHOL/HDL Ratio: 2.8 Ratio (ref <5.0)
VLDL: 19 mg/dL (ref <30)

## 2016-08-23 LAB — TSH: TSH: 0.92 mIU/L (ref 0.40–4.50)

## 2016-08-25 ENCOUNTER — Ambulatory Visit (INDEPENDENT_AMBULATORY_CARE_PROVIDER_SITE_OTHER): Payer: BLUE CROSS/BLUE SHIELD | Admitting: Family Medicine

## 2016-08-25 VITALS — BP 110/80 | HR 60 | Temp 98.3°F | Ht 71.0 in | Wt 243.0 lb

## 2016-08-25 DIAGNOSIS — E7800 Pure hypercholesterolemia, unspecified: Secondary | ICD-10-CM

## 2016-08-25 DIAGNOSIS — E038 Other specified hypothyroidism: Secondary | ICD-10-CM

## 2016-08-25 DIAGNOSIS — H6502 Acute serous otitis media, left ear: Secondary | ICD-10-CM | POA: Diagnosis not present

## 2016-08-25 NOTE — Progress Notes (Signed)
Subjective:    Patient ID: Juan Stone, male    DOB: Apr 03, 1961, 55 y.o.   MRN: [id number]  HPI Patient is here today for follow-up. He has hypothyroidism. His current TSH is within therapeutic limits at 0.95. He also has hyperlipidemia on simvastatin. He denies any myalgias or right upper quadrant pain. He denies any chest pain shortness of breath or dyspnea on exertion. His most recent lab work as listed below Appointment on 08/23/2016  Component Date Value Ref Range Status  . WBC 08/23/2016 5.1  3.8 - 10.8 K/uL Final  . RBC 08/23/2016 4.86  4.20 - 5.80 MIL/uL Final  . Hemoglobin 08/23/2016 15.2  13.0 - 17.0 g/dL Final  . HCT 08/23/2016 45.5  38.5 - 50.0 % Final  . MCV 08/23/2016 93.6  80.0 - 100.0 fL Final  . MCH 08/23/2016 31.3  27.0 - 33.0 pg Final  . MCHC 08/23/2016 33.4  32.0 - 36.0 g/dL Final  . RDW 08/23/2016 13.0  11.0 - 15.0 % Final  . Platelets 08/23/2016 227  140 - 400 K/uL Final  . MPV 08/23/2016 9.3  7.5 - 12.5 fL Final  . Neutro Abs 08/23/2016 3162  1,500 - 7,800 cells/uL Final  . Lymphs Abs 08/23/2016 1275  850 - 3,900 cells/uL Final  . Monocytes Absolute 08/23/2016 459  200 - 950 cells/uL Final  . Eosinophils Absolute 08/23/2016 153  15 - 500 cells/uL Final  . Basophils Absolute 08/23/2016 51  0 - 200 cells/uL Final  . Neutrophils Relative % 08/23/2016 62  % Final  . Lymphocytes Relative 08/23/2016 25  % Final  . Monocytes Relative 08/23/2016 9  % Final  . Eosinophils Relative 08/23/2016 3  % Final  . Basophils Relative 08/23/2016 1  % Final  . Smear Review 08/23/2016 Criteria for review not met   Final  . Sodium 08/23/2016 139  135 - 146 mmol/L Final  . Potassium 08/23/2016 5.0  3.5 - 5.3 mmol/L Final  . Chloride 08/23/2016 102  98 - 110 mmol/L Final  . CO2 08/23/2016 26  20 - 31 mmol/L Final  . Glucose, Bld 08/23/2016 89  70 - 99 mg/dL Final  . BUN 08/23/2016 12  7 - 25 mg/dL Final  . Creat 08/23/2016 0.98  0.70 - 1.33 mg/dL Final   Comment:   For  patients > or = 57 years of age: The upper reference limit for Creatinine is approximately 13% higher for people identified as African-American.     . Total Bilirubin 08/23/2016 0.6  0.2 - 1.2 mg/dL Final  . Alkaline Phosphatase 08/23/2016 62  40 - 115 U/L Final  . AST 08/23/2016 22  10 - 35 U/L Final  . ALT 08/23/2016 19  9 - 46 U/L Final  . Total Protein 08/23/2016 6.5  6.1 - 8.1 g/dL Final  . Albumin 08/23/2016 4.1  3.6 - 5.1 g/dL Final  . Calcium 08/23/2016 9.2  8.6 - 10.3 mg/dL Final  . GFR, Est African American 08/23/2016 >89  >=60 mL/min Final  . GFR, Est Non African American 08/23/2016 85  >=60 mL/min Final  . Cholesterol 08/23/2016 159  <200 mg/dL Final  . Triglycerides 08/23/2016 97  <150 mg/dL Final  . HDL 08/23/2016 57  >40 mg/dL Final  . Total CHOL/HDL Ratio 08/23/2016 2.8  <5.0 Ratio Final  . VLDL 08/23/2016 19  <30 mg/dL Final  . LDL Cholesterol 08/23/2016 83  <100 mg/dL Final  . TSH 08/23/2016 0.92  0.40 - 4.50 mIU/L Final  He also complains of pressure in his right ear. He denies any pain or fevers or chills or sinus pain. On examination today there is a middle ear effusion that is yellow and opaque behind his right tympanic membrane Past Medical History:  Diagnosis Date  . Atherosclerosis of arteries   . Hyperlipidemia   . Obesity   . Thyroid disease    hypothyroidism   Past Surgical History:  Procedure Laterality Date  . CHOLECYSTECTOMY, LAPAROSCOPIC     Dr. Fausto Skillern  . CORNEAL TRANSPLANT     Fuch's dystorphy (bilateral)   Current Outpatient Prescriptions on File Prior to Visit  Medication Sig Dispense Refill  . aspirin 81 MG chewable tablet Chew 81 mg by mouth daily.    . clotrimazole-betamethasone (LOTRISONE) cream APPLY TO AFFECTED AREA TWICE A DAY 30 g 0  . fluorometholone (FML) 0.1 % ophthalmic suspension 1 drop every 4 (four) hours.    . fluticasone (FLONASE) 50 MCG/ACT nasal spray Place 2 sprays into both nostrils daily as needed for allergies or  rhinitis. 16 g 11  . levothyroxine (SYNTHROID, LEVOTHROID) 175 MCG tablet Take 1 tablet (175 mcg total) by mouth daily before breakfast. 90 tablet 3  . Multiple Vitamins tablet Take 1 tablet by mouth.    . simvastatin (ZOCOR) 20 MG tablet Take 1 tablet (20 mg total) by mouth at bedtime. 90 tablet 3   No current facility-administered medications on file prior to visit.    No Known Allergies Social History   Social History  . Marital status: Married    Spouse name: N/A  . Number of children: N/A  . Years of education: N/A   Occupational History  . Not on file.   Social History Main Topics  . Smoking status: Never Smoker  . Smokeless tobacco: Never Used  . Alcohol use No     Comment: Ocassional  . Drug use: No  . Sexual activity: Not on file   Other Topics Concern  . Not on file   Social History Narrative  . No narrative on file      Review of Systems  All other systems reviewed and are negative.      Objective:   Physical Exam  Constitutional: He appears well-developed and well-nourished.  HENT:  Right Ear: Tympanic membrane is not injected, not perforated, not erythematous, not retracted and not bulging. Tympanic membrane mobility is abnormal. A middle ear effusion is present.  Cardiovascular: Normal rate, regular rhythm and normal heart sounds.   Pulmonary/Chest: Effort normal and breath sounds normal. No respiratory distress. He has no wheezes. He has no rales.  Abdominal: Soft. Bowel sounds are normal. He exhibits no distension. There is no tenderness. There is no rebound and no guarding.  Vitals reviewed.         Assessment & Plan:  Pure hypercholesterolemia  Other specified hypothyroidism  Acute serous otitis media of left ear, recurrence not specified  Begin a combination of Sudafed and antihistamines along with eustachian tube exercises to help hopefully improve his eustachian tube dysfunction and resolve the middle ear effusion. Also add Flonase 2  sprays each nostril daily. Recheck in 2-3 weeks if no better or sooner if worse. Thyroid and HLD are adequately treated with normal lab values. No change the medication at this time. Immunizations are up-to-date. PSA is due in February

## 2016-09-06 DIAGNOSIS — Z961 Presence of intraocular lens: Secondary | ICD-10-CM | POA: Diagnosis not present

## 2016-09-06 DIAGNOSIS — G4733 Obstructive sleep apnea (adult) (pediatric): Secondary | ICD-10-CM | POA: Diagnosis not present

## 2016-09-06 DIAGNOSIS — Z947 Corneal transplant status: Secondary | ICD-10-CM | POA: Diagnosis not present

## 2016-09-12 DIAGNOSIS — G4733 Obstructive sleep apnea (adult) (pediatric): Secondary | ICD-10-CM | POA: Diagnosis not present

## 2016-12-25 ENCOUNTER — Other Ambulatory Visit: Payer: Self-pay | Admitting: Family Medicine

## 2017-01-10 DIAGNOSIS — R1084 Generalized abdominal pain: Secondary | ICD-10-CM | POA: Diagnosis not present

## 2017-01-10 DIAGNOSIS — M549 Dorsalgia, unspecified: Secondary | ICD-10-CM | POA: Diagnosis not present

## 2017-01-10 DIAGNOSIS — R140 Abdominal distension (gaseous): Secondary | ICD-10-CM | POA: Diagnosis not present

## 2017-06-20 DIAGNOSIS — Z961 Presence of intraocular lens: Secondary | ICD-10-CM | POA: Diagnosis not present

## 2017-06-20 DIAGNOSIS — Z947 Corneal transplant status: Secondary | ICD-10-CM | POA: Diagnosis not present

## 2017-07-06 ENCOUNTER — Other Ambulatory Visit: Payer: Self-pay | Admitting: Family Medicine

## 2017-10-15 ENCOUNTER — Encounter: Payer: Self-pay | Admitting: Family Medicine

## 2017-10-15 ENCOUNTER — Ambulatory Visit (INDEPENDENT_AMBULATORY_CARE_PROVIDER_SITE_OTHER): Payer: BLUE CROSS/BLUE SHIELD | Admitting: Family Medicine

## 2017-10-15 VITALS — BP 128/70 | HR 64 | Temp 98.4°F | Resp 16 | Ht 71.0 in | Wt 263.0 lb

## 2017-10-15 DIAGNOSIS — E7800 Pure hypercholesterolemia, unspecified: Secondary | ICD-10-CM

## 2017-10-15 DIAGNOSIS — E039 Hypothyroidism, unspecified: Secondary | ICD-10-CM

## 2017-10-15 DIAGNOSIS — L500 Allergic urticaria: Secondary | ICD-10-CM | POA: Diagnosis not present

## 2017-10-15 DIAGNOSIS — Z0000 Encounter for general adult medical examination without abnormal findings: Secondary | ICD-10-CM | POA: Diagnosis not present

## 2017-10-15 MED ORDER — OMEPRAZOLE 40 MG PO CPDR: 40.0000 mg | DELAYED_RELEASE_CAPSULE | Freq: Every day | ORAL | 3 refills | Status: DC

## 2017-10-15 NOTE — Progress Notes (Signed)
Subjective:    Patient ID: Juan Stone, male    DOB: 1961-06-30, 57 y.o.   MRN: [id number]  HPI Patient is here today for complete physical exam. Past medical history is significant for [facility]. He also had several tick bites over the summer. Beginning this summer, the patient has demonstrated an allergy to certain meat. He has broken out into urticaria on 2 separate occasions when he ate red meat. He also had a similar reaction to pork. He has been able to eat chicken and fish without difficulty. He can find no other food that triggers the reaction. It is definitely associated with red meat and pork. This seemed to follow his tick bite. His colonoscopy was performed in 2018 and was significant for 2 polyps. He is due for repeat colonoscopy in 4 more years. He is due for PSA for prostate cancer screening. His flu shot is up-to-date. He is due for the shingles vaccine. He also reports indigestion and bloating. In the past this is gone away with a proton pump inhibitor. Past Medical History:  Diagnosis Date  . Atherosclerosis of arteries   . Hyperlipidemia   . Obesity   . Thyroid disease    hypothyroidism   Past Surgical History:  Procedure Laterality Date  . CHOLECYSTECTOMY, LAPAROSCOPIC     Dr. Coralyn PearEvans [facility]  . CORNEAL TRANSPLANT     Fuch's dystorphy (bilateral)   Current Outpatient Medications on File Prior to Visit  Medication Sig Dispense Refill  . aspirin 81 MG chewable tablet Chew 81 mg by mouth daily.    . clotrimazole-betamethasone (LOTRISONE) cream APPLY TO AFFECTED AREA TWICE A DAY 30 g 0  . fluorometholone (FML) 0.1 % ophthalmic suspension 1 drop every 4 (four) hours.    . fluticasone (FLONASE) 50 MCG/ACT nasal spray Place 2 sprays into both nostrils daily as needed for allergies or rhinitis. 16 g 11  . levothyroxine (SYNTHROID, LEVOTHROID) 175 MCG tablet TAKE 1 TABLET DAILY BEFORE BREAKFAST 90 tablet 3  . Multiple Vitamins tablet Take 1 tablet by mouth.    .  simvastatin (ZOCOR) 20 MG tablet TAKE 1 TABLET AT BEDTIME 90 tablet 0   No current facility-administered medications on file prior to visit.    No Known Allergies Social History   Socioeconomic History  . Marital status: Married    Spouse name: Not on file  . Number of children: Not on file  . Years of education: Not on file  . Highest education level: Not on file  Social Needs  . Financial resource strain: Not on file  . Food insecurity - worry: Not on file  . Food insecurity - inability: Not on file  . Transportation needs - medical: Not on file  . Transportation needs - non-medical: Not on file  Occupational History  . Not on file  Tobacco Use  . Smoking status: Never Smoker  . Smokeless tobacco: Never Used  Substance and Sexual Activity  . Alcohol use: No    Comment: Ocassional  . Drug use: No  . Sexual activity: Not on file  Other Topics Concern  . Not on file  Social History Narrative  . Not on file   Family History  Problem Relation Age of Onset  . COPD Mother   . Hyperlipidemia Father   . Heart disease Father       Review of Systems  All other systems reviewed and are negative.      Objective:   Physical Exam  Constitutional: He is oriented to person, place, and time. He appears well-developed and well-nourished. No distress.  HENT:  Head: Normocephalic and atraumatic.  Right Ear: External ear normal.  Left Ear: External ear normal.  Nose: Nose normal.  Mouth/Throat: Oropharynx is clear and moist. No oropharyngeal exudate.  Eyes: Conjunctivae and EOM are normal. Pupils are equal, round, and reactive to light. Right eye exhibits no discharge. No scleral icterus.  Neck: Normal range of motion. Neck supple. No JVD present. No tracheal deviation present. No thyromegaly present.  Cardiovascular: Normal rate, regular rhythm, normal heart sounds and intact distal pulses. Exam reveals no gallop and no friction rub.  No murmur heard. Pulmonary/Chest: Effort  normal and breath sounds normal. No stridor. No respiratory distress. He has no wheezes. He has no rales. He exhibits no tenderness.  Abdominal: Soft. Bowel sounds are normal. He exhibits no distension and no mass. There is no tenderness. There is no rebound and no guarding.  Musculoskeletal: Normal range of motion. He exhibits no edema, tenderness or deformity.  Lymphadenopathy:    He has no cervical adenopathy.  Neurological: He is alert and oriented to person, place, and time. He has normal reflexes. He displays normal reflexes. No cranial nerve deficit. He exhibits normal muscle tone. Coordination normal.  Skin: Skin is warm. No rash noted. He is not diaphoretic. No erythema. No pallor.  Psychiatric: He has a normal mood and affect. His behavior is normal. Judgment and thought content normal.  Vitals reviewed.         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 I recommended a shingles vaccine. His colonoscopy is up-to-date. I will screen the patient for prostate cancer with a PSA. Hepatitis C screening is up-to-date. I will monitor the treatment of hyperthyroidism with a TSH. I will also check a CBC, CMP, and fasting lipid panel to monitor the treatment of his hyperlipidemia. His blood pressure today is acceptable. I believe the patient may have developed an allergy to certain meat after a recent tick bite. I will check an animal group allergy panel to see if he has IgE antibodies to be simple work, Catering manager. recommended starting Prilosec 40 mg a day for indigestion and possible GERD. Consider H. pylori breath test if persistent

## 2017-10-17 ENCOUNTER — Encounter: Payer: Self-pay | Admitting: Family Medicine

## 2017-10-18 ENCOUNTER — Encounter: Payer: Self-pay | Admitting: Family Medicine

## 2017-10-18 LAB — LIPID PANEL
CHOL/HDL RATIO: 3 (calc) (ref <5.0)
Cholesterol: 153 mg/dL (ref <200)
HDL: 51 mg/dL (ref >40)
LDL Cholesterol (Calc): 82 mg/dL (calc)
NON-HDL CHOLESTEROL (CALC): 102 mg/dL (ref <130)
TRIGLYCERIDES: 107 mg/dL (ref <150)

## 2017-10-18 LAB — ALLERGY PANEL, ANIMAL GROUP
ALLERGEN, HORSE DANDER,E3: 0.35 kU/L — AB
Allergen, Mouse Urine Protein, e78: <0.1 kU/L
Allergen,Goose feathers, e70: <0.1 kU/L
CLASS: 0
CLASS: 2
CLASS: 0
CLASS: 0
CLASS: 1
COW DANDER IGE: 1.84 kU/L — AB

## 2017-10-18 LAB — TEST AUTHORIZATION

## 2017-10-18 LAB — TSH: TSH: 0.48 mIU/L (ref 0.40–4.50)

## 2017-10-18 LAB — PSA: PSA: 0.7 ng/mL (ref <=4.0)

## 2017-10-18 LAB — INTERPRETATION:

## 2017-10-19 ENCOUNTER — Other Ambulatory Visit: Payer: Self-pay | Admitting: Family Medicine

## 2017-10-19 ENCOUNTER — Encounter: Payer: Self-pay | Admitting: Family Medicine

## 2017-10-19 MED ORDER — EPINEPHRINE 0.3 MG/0.3ML IJ SOAJ: 0.3000 mg | Freq: Once | INTRAMUSCULAR | 3 refills | Status: AC

## 2017-10-22 ENCOUNTER — Other Ambulatory Visit: Payer: BLUE CROSS/BLUE SHIELD

## 2017-10-22 DIAGNOSIS — E039 Hypothyroidism, unspecified: Secondary | ICD-10-CM | POA: Diagnosis not present

## 2017-10-22 DIAGNOSIS — Z79899 Other long term (current) drug therapy: Secondary | ICD-10-CM

## 2017-10-22 DIAGNOSIS — Z0000 Encounter for general adult medical examination without abnormal findings: Secondary | ICD-10-CM

## 2017-10-22 DIAGNOSIS — E785 Hyperlipidemia, unspecified: Secondary | ICD-10-CM

## 2017-10-22 LAB — CBC WITH DIFFERENTIAL/PLATELET
BASOS ABS: 68 {cells}/uL (ref 0–200)
Basophils Relative: 1 %
EOS ABS: 741 {cells}/uL — AB (ref 15–500)
EOS PCT: 10.9 %
HCT: 44.6 % (ref 38.5–50.0)
HEMOGLOBIN: 15.1 g/dL (ref 13.2–17.1)
Lymphs Abs: 1693 cells/uL (ref 850–3900)
MCH: 30.7 pg (ref 27.0–33.0)
MCHC: 33.9 g/dL (ref 32.0–36.0)
MCV: 90.7 fL (ref 80.0–100.0)
MONOS PCT: 10.3 %
MPV: 9.4 fL (ref 7.5–12.5)
NEUTROS PCT: 52.9 %
Neutro Abs: 3597 cells/uL (ref 1500–7800)
PLATELETS: 248 10*3/uL (ref 140–400)
RBC: 4.92 10*6/uL (ref 4.20–5.80)
RDW: 12.1 % (ref 11.0–15.0)
TOTAL LYMPHOCYTE: 24.9 %
WBC mixed population: 700 cells/uL (ref 200–950)
WBC: 6.8 10*3/uL (ref 3.8–10.8)

## 2017-10-22 LAB — COMPREHENSIVE METABOLIC PANEL
AG Ratio: 2 (calc) (ref 1.0–2.5)
ALKALINE PHOSPHATASE (APISO): 68 U/L (ref 40–115)
ALT: 22 U/L (ref 9–46)
AST: 19 U/L (ref 10–35)
Albumin: 4.1 g/dL (ref 3.6–5.1)
BILIRUBIN TOTAL: 0.4 mg/dL (ref 0.2–1.2)
BUN: 12 mg/dL (ref 7–25)
CALCIUM: 9.6 mg/dL (ref 8.6–10.3)
CO2: 29 mmol/L (ref 20–32)
CREATININE: 0.97 mg/dL (ref 0.70–1.33)
Chloride: 104 mmol/L (ref 98–110)
Globulin: 2.1 g/dL (calc) (ref 1.9–3.7)
Glucose, Bld: 93 mg/dL (ref 65–99)
Potassium: 4.9 mmol/L (ref 3.5–5.3)
Sodium: 140 mmol/L (ref 135–146)
Total Protein: 6.2 g/dL (ref 6.1–8.1)

## 2017-10-23 ENCOUNTER — Encounter: Payer: Self-pay | Admitting: Family Medicine

## 2017-10-29 DIAGNOSIS — G4733 Obstructive sleep apnea (adult) (pediatric): Secondary | ICD-10-CM | POA: Diagnosis not present

## 2017-11-05 ENCOUNTER — Other Ambulatory Visit: Payer: Self-pay | Admitting: Family Medicine

## 2017-11-19 ENCOUNTER — Encounter: Payer: Self-pay | Admitting: Family Medicine

## 2017-12-04 ENCOUNTER — Telehealth: Payer: BLUE CROSS/BLUE SHIELD | Admitting: Family

## 2017-12-04 DIAGNOSIS — J019 Acute sinusitis, unspecified: Secondary | ICD-10-CM

## 2017-12-04 DIAGNOSIS — B9689 Other specified bacterial agents as the cause of diseases classified elsewhere: Secondary | ICD-10-CM

## 2017-12-04 MED ORDER — AMOXICILLIN-POT CLAVULANATE 875-125 MG PO TABS: 1.0000 | ORAL_TABLET | Freq: Two times a day (BID) | ORAL | 0 refills | Status: DC

## 2017-12-04 NOTE — Progress Notes (Signed)

## 2018-02-06 DIAGNOSIS — Z91018 Allergy to other foods: Secondary | ICD-10-CM | POA: Diagnosis not present

## 2018-02-06 DIAGNOSIS — Z6838 Body mass index (BMI) 38.0-38.9, adult: Secondary | ICD-10-CM | POA: Diagnosis not present

## 2018-02-08 ENCOUNTER — Other Ambulatory Visit: Payer: Self-pay | Admitting: Family Medicine

## 2018-02-15 ENCOUNTER — Encounter: Payer: Self-pay | Admitting: Family Medicine

## 2018-02-15 MED ORDER — SIMVASTATIN 20 MG PO TABS: 20.0000 mg | ORAL_TABLET | Freq: Every day | ORAL | 3 refills | Status: DC

## 2018-02-26 ENCOUNTER — Encounter: Payer: Self-pay | Admitting: Family Medicine

## 2018-02-26 DIAGNOSIS — Z91018 Allergy to other foods: Secondary | ICD-10-CM | POA: Insufficient documentation

## 2018-06-26 DIAGNOSIS — Z947 Corneal transplant status: Secondary | ICD-10-CM | POA: Diagnosis not present

## 2018-06-26 DIAGNOSIS — Z961 Presence of intraocular lens: Secondary | ICD-10-CM | POA: Diagnosis not present

## 2018-07-29 DIAGNOSIS — M19072 Primary osteoarthritis, left ankle and foot: Secondary | ICD-10-CM | POA: Diagnosis not present

## 2018-08-14 ENCOUNTER — Other Ambulatory Visit: Payer: BLUE CROSS/BLUE SHIELD

## 2018-08-14 DIAGNOSIS — Z79899 Other long term (current) drug therapy: Secondary | ICD-10-CM | POA: Diagnosis not present

## 2018-08-14 DIAGNOSIS — E7800 Pure hypercholesterolemia, unspecified: Secondary | ICD-10-CM

## 2018-08-14 DIAGNOSIS — E039 Hypothyroidism, unspecified: Secondary | ICD-10-CM

## 2018-08-14 LAB — TSH: TSH: 2.66 m[IU]/L (ref 0.40–4.50)

## 2018-08-15 LAB — COMPREHENSIVE METABOLIC PANEL
AG RATIO: 1.8 (calc) (ref 1.0–2.5)
ALT: 23 U/L (ref 9–46)
AST: 26 U/L (ref 10–35)
Albumin: 4.2 g/dL (ref 3.6–5.1)
Alkaline phosphatase (APISO): 68 U/L (ref 40–115)
BUN: 12 mg/dL (ref 7–25)
CO2: 25 mmol/L (ref 20–32)
Calcium: 9.1 mg/dL (ref 8.6–10.3)
Chloride: 103 mmol/L (ref 98–110)
Creat: 0.95 mg/dL (ref 0.70–1.33)
GLOBULIN: 2.3 g/dL (ref 1.9–3.7)
GLUCOSE: 96 mg/dL (ref 65–99)
Potassium: 5.3 mmol/L (ref 3.5–5.3)
SODIUM: 140 mmol/L (ref 135–146)
TOTAL PROTEIN: 6.5 g/dL (ref 6.1–8.1)
Total Bilirubin: 0.5 mg/dL (ref 0.2–1.2)

## 2018-08-15 LAB — LIPID PANEL
CHOL/HDL RATIO: 3 (calc) (ref <5.0)
Cholesterol: 166 mg/dL (ref <200)
HDL: 55 mg/dL (ref >40)
LDL CHOLESTEROL (CALC): 92 mg/dL
NON-HDL CHOLESTEROL (CALC): 111 mg/dL (ref <130)
TRIGLYCERIDES: 91 mg/dL (ref <150)

## 2018-09-02 DIAGNOSIS — M19072 Primary osteoarthritis, left ankle and foot: Secondary | ICD-10-CM | POA: Diagnosis not present

## 2018-09-25 DIAGNOSIS — H524 Presbyopia: Secondary | ICD-10-CM | POA: Diagnosis not present

## 2018-11-04 DIAGNOSIS — G4733 Obstructive sleep apnea (adult) (pediatric): Secondary | ICD-10-CM | POA: Diagnosis not present

## 2018-12-13 DIAGNOSIS — L820 Inflamed seborrheic keratosis: Secondary | ICD-10-CM | POA: Diagnosis not present

## 2019-03-07 ENCOUNTER — Other Ambulatory Visit: Payer: BC Managed Care – PPO

## 2019-03-07 ENCOUNTER — Other Ambulatory Visit: Payer: Self-pay

## 2019-03-07 DIAGNOSIS — Z125 Encounter for screening for malignant neoplasm of prostate: Secondary | ICD-10-CM | POA: Diagnosis not present

## 2019-03-07 DIAGNOSIS — E039 Hypothyroidism, unspecified: Secondary | ICD-10-CM | POA: Diagnosis not present

## 2019-03-07 DIAGNOSIS — E785 Hyperlipidemia, unspecified: Secondary | ICD-10-CM | POA: Diagnosis not present

## 2019-03-07 DIAGNOSIS — M25572 Pain in left ankle and joints of left foot: Secondary | ICD-10-CM | POA: Diagnosis not present

## 2019-03-10 ENCOUNTER — Encounter: Payer: Self-pay | Admitting: Family Medicine

## 2019-03-10 LAB — CBC WITH DIFFERENTIAL/PLATELET
Absolute Monocytes: 505 cells/uL (ref 200–950)
Basophils Absolute: 60 cells/uL (ref 0–200)
Basophils Relative: 1.2 %
Eosinophils Absolute: 220 cells/uL (ref 15–500)
Eosinophils Relative: 4.4 %
HCT: 43.2 % (ref 38.5–50.0)
Hemoglobin: 14.8 g/dL (ref 13.2–17.1)
Lymphs Abs: 1120 cells/uL (ref 850–3900)
MCH: 31.4 pg (ref 27.0–33.0)
MCHC: 34.3 g/dL (ref 32.0–36.0)
MCV: 91.5 fL (ref 80.0–100.0)
MPV: 9.7 fL (ref 7.5–12.5)
Monocytes Relative: 10.1 %
Neutro Abs: 3095 cells/uL (ref 1500–7800)
Neutrophils Relative %: 61.9 %
Platelets: 256 10*3/uL (ref 140–400)
RBC: 4.72 10*6/uL (ref 4.20–5.80)
RDW: 12.4 % (ref 11.0–15.0)
Total Lymphocyte: 22.4 %
WBC: 5 10*3/uL (ref 3.8–10.8)

## 2019-03-10 LAB — TEST AUTHORIZATION

## 2019-03-10 LAB — LIPID PANEL
Cholesterol: 173 mg/dL (ref <200)
HDL: 48 mg/dL (ref >=40)
LDL Cholesterol (Calc): 105 mg/dL (calc) — ABNORMAL HIGH
Non-HDL Cholesterol (Calc): 125 mg/dL (calc) (ref <130)
Total CHOL/HDL Ratio: 3.6 (calc) (ref <5.0)
Triglycerides: 102 mg/dL (ref <150)

## 2019-03-10 LAB — COMPLETE METABOLIC PANEL WITH GFR
AG Ratio: 1.7 (calc) (ref 1.0–2.5)
ALT: 15 U/L (ref 9–46)
AST: 21 U/L (ref 10–35)
Albumin: 4.3 g/dL (ref 3.6–5.1)
Alkaline phosphatase (APISO): 68 U/L (ref 35–144)
BUN: 13 mg/dL (ref 7–25)
CO2: 22 mmol/L (ref 20–32)
Calcium: 9.1 mg/dL (ref 8.6–10.3)
Chloride: 103 mmol/L (ref 98–110)
Creat: 1.06 mg/dL (ref 0.70–1.25)
GFR, Est African American: 88 mL/min/{1.73_m2} (ref >=60)
GFR, Est Non African American: 76 mL/min/{1.73_m2} (ref >=60)
Globulin: 2.5 g/dL (calc) (ref 1.9–3.7)
Glucose, Bld: 97 mg/dL (ref 65–99)
Potassium: 5.1 mmol/L (ref 3.5–5.3)
Sodium: 139 mmol/L (ref 135–146)
Total Bilirubin: 0.6 mg/dL (ref 0.2–1.2)
Total Protein: 6.8 g/dL (ref 6.1–8.1)

## 2019-03-10 LAB — LDL CHOLESTEROL, DIRECT: Direct LDL: 107 mg/dL — ABNORMAL HIGH (ref <100)

## 2019-03-10 LAB — TSH: TSH: 21.62 mIU/L — ABNORMAL HIGH (ref 0.40–4.50)

## 2019-03-10 LAB — PSA: PSA: 0.6 ng/mL (ref <=4.0)

## 2019-03-12 ENCOUNTER — Encounter: Payer: Self-pay | Admitting: Family Medicine

## 2019-03-14 ENCOUNTER — Other Ambulatory Visit: Payer: Self-pay

## 2019-03-14 ENCOUNTER — Encounter: Payer: Self-pay | Admitting: Family Medicine

## 2019-03-14 ENCOUNTER — Ambulatory Visit (INDEPENDENT_AMBULATORY_CARE_PROVIDER_SITE_OTHER): Payer: BC Managed Care – PPO | Admitting: Family Medicine

## 2019-03-14 VITALS — BP 136/80 | HR 70 | Temp 98.8°F | Resp 16 | Ht 71.0 in | Wt 268.0 lb

## 2019-03-14 DIAGNOSIS — Z0000 Encounter for general adult medical examination without abnormal findings: Secondary | ICD-10-CM

## 2019-03-14 DIAGNOSIS — Z0001 Encounter for general adult medical examination with abnormal findings: Secondary | ICD-10-CM

## 2019-03-14 DIAGNOSIS — Z91018 Allergy to other foods: Secondary | ICD-10-CM | POA: Diagnosis not present

## 2019-03-14 DIAGNOSIS — E7800 Pure hypercholesterolemia, unspecified: Secondary | ICD-10-CM | POA: Diagnosis not present

## 2019-03-14 DIAGNOSIS — E039 Hypothyroidism, unspecified: Secondary | ICD-10-CM

## 2019-03-14 MED ORDER — LEVOTHYROXINE SODIUM 200 MCG PO TABS: 200.0000 ug | ORAL_TABLET | Freq: Every day | ORAL | 2 refills | Status: DC

## 2019-03-14 MED ORDER — TRAZODONE HCL 50 MG PO TABS: 25.0000 mg | ORAL_TABLET | Freq: Every evening | ORAL | 3 refills | Status: DC | PRN

## 2019-03-14 MED ORDER — MOMETASONE FUROATE 0.1 % EX CREA: 1.0000 "application " | TOPICAL_CREAM | Freq: Every day | CUTANEOUS | 0 refills | Status: DC

## 2019-03-14 NOTE — Progress Notes (Signed)
Subjective:    Patient ID: Juan Stone, male    DOB: 11-Mar-1961, 58 y.o.   MRN: [id number]  HPI Patient is a 58 year old white male here today for complete physical exam.  His last colonoscopy was in December 2012.  The report I received from his gastroenterologist recommended a repeat colonoscopy in 5 years.  Therefore he is overdue.  He is also due for prostate cancer screening today.  His PSA is excellent at 0.6.  His immunization records are included below: Immunization History  Administered Date(s) Administered  . Influenza Inj Mdck Quad Pf 07/08/2018  . Influenza, Seasonal, Injecte, Preservative Fre 07/08/2018  . Influenza,inj,Quad PF,6+ Mos 07/01/2015  . Influenza-Unspecified 06/18/2013, 08/07/2014, 07/02/2016, 06/02/2017  . Tdap 07/18/2006, 09/09/2014   Patient is due for Shingrix. Lab on 03/07/2019  Component Date Value Ref Range Status  . PSA 03/07/2019 0.6  < OR = 4.0 ng/mL Final   Comment: The total PSA value from this assay system is  standardized against the WHO standard. The test  result will be approximately 20% lower when compared  to the equimolar-standardized total PSA (Beckman  Coulter). Comparison of serial PSA results should be  interpreted with this fact in mind. . This test was performed using the Siemens  chemiluminescent method. Values obtained from  different assay methods cannot be used interchangeably. PSA levels, regardless of value, should not be interpreted as absolute evidence of the presence or absence of disease.   . WBC 03/07/2019 5.0  3.8 - 10.8 Thousand/uL Final  . RBC 03/07/2019 4.72  4.20 - 5.80 Million/uL Final  . Hemoglobin 03/07/2019 14.8  13.2 - 17.1 g/dL Final  . HCT 95/62/130806/19/2020 43.2  38.5 - 50.0 % Final  . MCV 03/07/2019 91.5  80.0 - 100.0 fL Final  . MCH 03/07/2019 31.4  27.0 - 33.0 pg Final  . MCHC 03/07/2019 34.3  32.0 - 36.0 g/dL Final  . RDW 65/78/469606/19/2020 12.4  11.0 - 15.0 % Final  . Platelets 03/07/2019 256  140 - 400  Thousand/uL Final  . MPV 03/07/2019 9.7  7.5 - 12.5 fL Final  . Neutro Abs 03/07/2019 3,095  1,500 - 7,800 cells/uL Final  . Lymphs Abs 03/07/2019 1,120  850 - 3,900 cells/uL Final  . Absolute Monocytes 03/07/2019 505  200 - 950 cells/uL Final  . Eosinophils Absolute 03/07/2019 220  15 - 500 cells/uL Final  . Basophils Absolute 03/07/2019 60  0 - 200 cells/uL Final  . Neutrophils Relative % 03/07/2019 61.9  % Final  . Total Lymphocyte 03/07/2019 22.4  % Final  . Monocytes Relative 03/07/2019 10.1  % Final  . Eosinophils Relative 03/07/2019 4.4  % Final  . Basophils Relative 03/07/2019 1.2  % Final  . Glucose, Bld 03/07/2019 97  65 - 99 mg/dL Final   Comment: .            Fasting reference interval .   . BUN 03/07/2019 13  7 - 25 mg/dL Final  . Creat 29/52/841306/19/2020 1.06  0.70 - 1.25 mg/dL Final   Comment: For patients >60 years of age, the reference limit for Creatinine is approximately 13% higher for people identified as African-American. .   . GFR, Est Non African American 03/07/2019 76  > OR = 60 mL/min/1.8473m2 Final  . GFR, Est African American 03/07/2019 88  > OR = 60 mL/min/1.3073m2 Final  . BUN/Creatinine Ratio 03/07/2019 NOT APPLICABLE  6 - 22 (calc) Final  . Sodium 03/07/2019 139  135 - 146 mmol/L  Final  . Potassium 03/07/2019 5.1  3.5 - 5.3 mmol/L Final  . Chloride 03/07/2019 103  98 - 110 mmol/L Final  . CO2 03/07/2019 22  20 - 32 mmol/L Final  . Calcium 03/07/2019 9.1  8.6 - 10.3 mg/dL Final  . Total Protein 03/07/2019 6.8  6.1 - 8.1 g/dL Final  . Albumin 40/98/119106/19/2020 4.3  3.6 - 5.1 g/dL Final  . Globulin 47/82/956206/19/2020 2.5  1.9 - 3.7 g/dL (calc) Final  . AG Ratio 03/07/2019 1.7  1.0 - 2.5 (calc) Final  . Total Bilirubin 03/07/2019 0.6  0.2 - 1.2 mg/dL Final  . Alkaline phosphatase (APISO) 03/07/2019 68  35 - 144 U/L Final  . AST 03/07/2019 21  10 - 35 U/L Final  . ALT 03/07/2019 15  9 - 46 U/L Final  . Direct LDL 03/07/2019 107* <100 mg/dL Final   Comment: Greatly elevated  Triglycerides values (>1200 mg/dL) interfere with the dLDL assay. As no Triglycerides  testing was ordered, interpret results with caution. . Desirable range <100 mg/dL for primary prevention;   <70 mg/dL for patients with CHD or diabetic patients  with > or = 2 CHD risk factors. .   . TSH 03/07/2019 21.62* 0.40 - 4.50 mIU/L Final  . Cholesterol 03/07/2019 173  <200 mg/dL Final  . HDL 13/08/657806/19/2020 48  > OR = 40 mg/dL Final  . Triglycerides 03/07/2019 102  <150 mg/dL Final  . LDL Cholesterol (Calc) 03/07/2019 105* mg/dL (calc) Final   Comment: Reference range: <100 . Desirable range <100 mg/dL for primary prevention;   <70 mg/dL for patients with CHD or diabetic patients  with > or = 2 CHD risk factors. Marland Kitchen. LDL-C is now calculated using the Martin-Hopkins  calculation, which is a validated novel method providing  better accuracy than the Friedewald equation in the  estimation of LDL-C.  Horald PollenMartin SS et al. Lenox AhrJAMA. 4696;295(282013;310(19): 2061-2068  (http://education.[facility].com/faq/FAQ164)   . Total CHOL/HDL Ratio 03/07/2019 3.6  <4.1<5.0 (calc) Final  . Non-HDL Cholesterol (Calc) 03/07/2019 125  <130 mg/dL (calc) Final   Comment: For patients with diabetes plus 1 major ASCVD risk  factor, treating to a non-HDL-C goal of <100 mg/dL  (LDL-C of <32<70 mg/dL) is considered a therapeutic  option.   . TEST NAME: 03/07/2019 LIPID PANEL, STANDARD   Final  . TEST CODE: 03/07/2019 [id number]   Final  . CLIENT CONTACT: 03/07/2019 Delbert HarnessOXANNE WILSON   Final  . REPORT ALWAYS MESSAGE SIGNATURE 03/07/2019    Final   Comment: . The laboratory testing on this patient was verbally requested or confirmed by the ordering physician or his or her authorized representative after contact with an employee of [facility]. Federal regulations require that we maintain on file written authorization for all laboratory testing.  Accordingly we are asking that the ordering physician or his or her authorized  representative sign a copy of this report and promptly return it to the client service representative. . . Signature:____________________________________________________ . Please fax this signed page to [phone] or return it via your [facility] courier.     Past Medical History:  Diagnosis Date  . 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 Atherosclerosis of arteries   . Hyperlipidemia   . Obesity   . Thyroid disease    hypothyroidism   Past Surgical History:  Procedure Laterality Date  . CHOLECYSTECTOMY, LAPAROSCOPIC     [facility]  . CORNEAL TRANSPLANT  Fuch's dystorphy (bilateral)   Current Outpatient Medications on File Prior to Visit  Medication Sig Dispense Refill  . aspirin 81 MG chewable tablet Chew 81 mg by mouth daily.    . clotrimazole-betamethasone (LOTRISONE) cream APPLY TO AFFECTED AREA TWICE A DAY 30 g 0  . fluorometholone (FML) 0.1 % ophthalmic suspension 1 drop every 4 (four) hours.    . fluticasone (FLONASE) 50 MCG/ACT nasal spray Place 2 sprays into both nostrils daily as needed for allergies or rhinitis. 16 g 11  . levothyroxine (SYNTHROID, LEVOTHROID) 175 MCG tablet TAKE 1 TABLET DAILY BEFORE BREAKFAST 90 tablet 3  . Multiple Vitamins tablet Take 1 tablet by mouth.    . simvastatin (ZOCOR) 20 MG tablet Take 1 tablet (20 mg total) by mouth at bedtime. 90 tablet 3  . omeprazole (PRILOSEC) 40 MG capsule Take 1 capsule (40 mg total) by mouth daily. (Patient not taking: Reported on 03/14/2019) 30 capsule 3   No current facility-administered medications on file prior to visit.    No Known Allergies Social History   Socioeconomic History  . Marital status: Married    Spouse name: Not on file  . Number of children: Not on file  . Years of education: Not on file  . Highest education level: Not on file  Occupational History  . Not on file  Social Needs  . Financial resource strain: Not on file  . Food  insecurity    Worry: Not on file    Inability: Not on file  . Transportation needs    Medical: Not on file    Non-medical: Not on file  Tobacco Use  . Smoking status: Never Smoker  . Smokeless tobacco: Never Used  Substance and Sexual Activity  . Alcohol use: No    Comment: Ocassional  . Drug use: No  . Sexual activity: Not on file  Lifestyle  . Physical activity    Days per week: Not on file    Minutes per session: Not on file  . Stress: Not on file  Relationships  . Social Musicianconnections    Talks on phone: Not on file    Gets together: Not on file    Attends religious service: Not on file    Active member of club or organization: Not on file    Attends meetings of clubs or organizations: Not on file    Relationship status: Not on file  . Intimate partner violence    Fear of current or ex partner: Not on file    Emotionally abused: Not on file    Physically abused: Not on file    Forced sexual activity: Not on file  Other Topics Concern  . Not on file  Social History Narrative  . Not on file      Review of Systems  All other systems reviewed and are negative.      Objective:   Physical Exam  Constitutional: He is oriented to person, place, and time. He appears well-developed and well-nourished. No distress.  HENT:  Head: Normocephalic and atraumatic.  Right Ear: Tympanic membrane, external ear and ear canal normal.  Left Ear: Tympanic membrane, external ear and ear canal normal.  Nose: Nose normal.  Mouth/Throat: Oropharynx is clear and moist. No oropharyngeal exudate.  Eyes: Pupils are equal, round, and reactive to light. Conjunctivae and EOM are normal. Right eye exhibits no discharge. Left eye exhibits no discharge. No scleral icterus.  Neck: Normal range of motion. Neck supple. No JVD present. No  tracheal deviation present. No thyromegaly present.  Cardiovascular: Normal rate, regular rhythm and normal heart sounds. Exam reveals no gallop and no friction rub.   No murmur heard. Pulmonary/Chest: Effort normal and breath sounds normal. No stridor. No respiratory distress. He has no wheezes. He has no rales.  Abdominal: Soft. Bowel sounds are normal. He exhibits no distension and no mass. There is no abdominal tenderness. There is no rebound and no guarding.  Musculoskeletal:        General: No edema.  Lymphadenopathy:    He has no cervical adenopathy.  Neurological: He is alert and oriented to person, place, and time. He displays normal reflexes. No cranial nerve deficit. He exhibits normal muscle tone. Coordination normal.  Skin: Skin is warm. No rash noted. He is not diaphoretic. No erythema. No pallor.  Psychiatric: He has a normal mood and affect. His behavior is normal. Judgment and thought content normal.  Vitals reviewed.         Assessment & Plan:  The primary encounter diagnosis was General medical exam. Diagnoses of Hypothyroidism, unspecified type, Pure hypercholesterolemia, and Allergy to alpha-gal were also pertinent to this visit. Patient has several concerns.  First he reports trouble with sleep.  He states that he is having difficult time staying asleep at night.  He is compliant with his CPAP machine and that is working well.  He denies any habits that cause poor sleep hygiene.  Therefore we have elected to try trazodone 50 mg p.o. nightly as needed insomnia.  He also reports urinary urgency and frequency but denies any dysuria.  He also denies any weak or dribbling stream.  Symptoms are consistent with overactive bladder but at the present time his symptoms are not bad enough to warrant medication.  If they do worsen I would try Vesicare.  Patient also thinks his colonoscopy was performed 3 years ago but we do not have records of this.  He will check with his previous records but he believes his colonoscopy is up-to-date.  PSA is outstanding.  I recommended the shingles vaccine.  We will increase his levothyroxine to 200 mcg a day and  recheck a TSH in 6 to 8 weeks.  Otherwise the remainder of his lab work is excellent.

## 2019-03-30 ENCOUNTER — Encounter: Payer: Self-pay | Admitting: Family Medicine

## 2019-03-31 MED ORDER — HYDROCORT-PRAMOXINE (PERIANAL) 2.5-1 % EX CREA: 1.0000 "application " | TOPICAL_CREAM | Freq: Three times a day (TID) | CUTANEOUS | 0 refills | Status: DC

## 2019-04-07 ENCOUNTER — Encounter: Payer: BLUE CROSS/BLUE SHIELD | Admitting: Family Medicine

## 2019-04-16 ENCOUNTER — Other Ambulatory Visit: Payer: Self-pay | Admitting: Family Medicine

## 2019-05-02 ENCOUNTER — Other Ambulatory Visit: Payer: Self-pay

## 2019-05-02 ENCOUNTER — Other Ambulatory Visit: Payer: BC Managed Care – PPO

## 2019-05-02 DIAGNOSIS — E039 Hypothyroidism, unspecified: Secondary | ICD-10-CM | POA: Diagnosis not present

## 2019-05-02 LAB — TSH: TSH: 0.07 mIU/L — ABNORMAL LOW (ref 0.40–4.50)

## 2019-05-06 MED ORDER — LEVOTHYROXINE SODIUM 175 MCG PO TABS: 175.0000 ug | ORAL_TABLET | ORAL | 2 refills | Status: DC

## 2019-05-06 MED ORDER — LEVOTHYROXINE SODIUM 175 MCG PO TABS: 175.0000 ug | ORAL_TABLET | Freq: Every day | ORAL | 2 refills | Status: DC

## 2019-05-06 MED ORDER — LEVOTHYROXINE SODIUM 200 MCG PO TABS: 200.0000 ug | ORAL_TABLET | ORAL | 2 refills | Status: DC

## 2019-05-07 ENCOUNTER — Other Ambulatory Visit: Payer: Self-pay | Admitting: Family Medicine

## 2019-05-07 MED ORDER — TRAZODONE HCL 50 MG PO TABS: 25.0000 mg | ORAL_TABLET | Freq: Every evening | ORAL | 3 refills | Status: DC | PRN

## 2019-06-26 ENCOUNTER — Other Ambulatory Visit: Payer: Self-pay

## 2019-06-26 ENCOUNTER — Other Ambulatory Visit: Payer: BC Managed Care – PPO

## 2019-06-26 DIAGNOSIS — E039 Hypothyroidism, unspecified: Secondary | ICD-10-CM | POA: Diagnosis not present

## 2019-06-27 ENCOUNTER — Other Ambulatory Visit: Payer: Self-pay | Admitting: Family Medicine

## 2019-06-27 DIAGNOSIS — E039 Hypothyroidism, unspecified: Secondary | ICD-10-CM

## 2019-06-27 LAB — TSH: TSH: 10.74 mIU/L — ABNORMAL HIGH (ref 0.40–4.50)

## 2019-07-02 DIAGNOSIS — Z947 Corneal transplant status: Secondary | ICD-10-CM | POA: Diagnosis not present

## 2019-07-02 DIAGNOSIS — Z961 Presence of intraocular lens: Secondary | ICD-10-CM | POA: Diagnosis not present

## 2019-07-08 ENCOUNTER — Encounter: Payer: Self-pay | Admitting: Family Medicine

## 2019-07-23 ENCOUNTER — Other Ambulatory Visit: Payer: Self-pay

## 2019-07-23 MED ORDER — LEVOTHYROXINE SODIUM 200 MCG PO TABS: 200.0000 ug | ORAL_TABLET | ORAL | 2 refills | Status: DC

## 2019-08-19 ENCOUNTER — Encounter: Payer: Self-pay | Admitting: Family Medicine

## 2019-08-19 MED ORDER — LEVOTHYROXINE SODIUM 175 MCG PO TABS: 175.0000 ug | ORAL_TABLET | ORAL | 2 refills | Status: DC

## 2019-08-19 MED ORDER — LEVOTHYROXINE SODIUM 200 MCG PO TABS: 200.0000 ug | ORAL_TABLET | ORAL | 2 refills | Status: DC

## 2019-08-20 MED ORDER — LEVOTHYROXINE SODIUM 200 MCG PO TABS: 200.0000 ug | ORAL_TABLET | Freq: Every day | ORAL | 2 refills | Status: DC

## 2019-08-20 NOTE — Addendum Note (Signed)
Addended by: Sheral Flow on: 08/20/2019 02:54 PM   Modules accepted: Orders

## 2019-08-25 MED ORDER — LEVOTHYROXINE SODIUM 200 MCG PO TABS: 200.0000 ug | ORAL_TABLET | Freq: Every day | ORAL | 2 refills | Status: DC

## 2019-08-25 NOTE — Addendum Note (Signed)
Addended by: Shary Decamp B on: 08/25/2019 08:43 AM   Modules accepted: Orders

## 2019-10-10 DIAGNOSIS — M7662 Achilles tendinitis, left leg: Secondary | ICD-10-CM | POA: Diagnosis not present

## 2019-10-10 DIAGNOSIS — M79672 Pain in left foot: Secondary | ICD-10-CM | POA: Diagnosis not present

## 2019-11-10 DIAGNOSIS — G4733 Obstructive sleep apnea (adult) (pediatric): Secondary | ICD-10-CM | POA: Diagnosis not present

## 2020-05-02 DIAGNOSIS — S63502A Unspecified sprain of left wrist, initial encounter: Secondary | ICD-10-CM | POA: Diagnosis not present

## 2020-05-02 DIAGNOSIS — S8001XA Contusion of right knee, initial encounter: Secondary | ICD-10-CM | POA: Diagnosis not present

## 2020-05-06 ENCOUNTER — Encounter: Payer: Self-pay | Admitting: Family Medicine

## 2020-05-06 MED ORDER — LEVOTHYROXINE SODIUM 175 MCG PO TABS: 175.0000 ug | ORAL_TABLET | ORAL | 0 refills | Status: DC

## 2020-05-06 MED ORDER — LEVOTHYROXINE SODIUM 200 MCG PO TABS: 200.0000 ug | ORAL_TABLET | Freq: Every day | ORAL | 0 refills | Status: DC

## 2020-05-08 ENCOUNTER — Other Ambulatory Visit: Payer: Self-pay | Admitting: Family Medicine

## 2020-05-12 ENCOUNTER — Other Ambulatory Visit: Payer: Self-pay

## 2020-05-12 MED ORDER — LEVOTHYROXINE SODIUM 200 MCG PO TABS: 200.0000 ug | ORAL_TABLET | Freq: Every day | ORAL | 0 refills | Status: DC

## 2020-05-19 ENCOUNTER — Other Ambulatory Visit: Payer: Self-pay

## 2020-05-19 MED ORDER — LEVOTHYROXINE SODIUM 200 MCG PO TABS: 200.0000 ug | ORAL_TABLET | Freq: Every day | ORAL | 0 refills | Status: DC

## 2020-05-28 ENCOUNTER — Encounter: Payer: Self-pay | Admitting: Family Medicine

## 2020-05-28 ENCOUNTER — Ambulatory Visit (INDEPENDENT_AMBULATORY_CARE_PROVIDER_SITE_OTHER): Payer: BC Managed Care – PPO | Admitting: Family Medicine

## 2020-05-28 ENCOUNTER — Other Ambulatory Visit: Payer: Self-pay

## 2020-05-28 VITALS — BP 120/84 | HR 62 | Temp 98.6°F | Ht 70.0 in | Wt 270.0 lb

## 2020-05-28 DIAGNOSIS — Z91018 Allergy to other foods: Secondary | ICD-10-CM

## 2020-05-28 DIAGNOSIS — Z0001 Encounter for general adult medical examination with abnormal findings: Secondary | ICD-10-CM | POA: Diagnosis not present

## 2020-05-28 DIAGNOSIS — Z125 Encounter for screening for malignant neoplasm of prostate: Secondary | ICD-10-CM | POA: Diagnosis not present

## 2020-05-28 DIAGNOSIS — E7800 Pure hypercholesterolemia, unspecified: Secondary | ICD-10-CM | POA: Diagnosis not present

## 2020-05-28 DIAGNOSIS — E039 Hypothyroidism, unspecified: Secondary | ICD-10-CM

## 2020-05-28 DIAGNOSIS — Z0000 Encounter for general adult medical examination without abnormal findings: Secondary | ICD-10-CM | POA: Diagnosis not present

## 2020-05-28 NOTE — Progress Notes (Signed)
Subjective:    Patient ID: Juan Stone, male    DOB: [birth year], 59 y.o.   MRN: [id number]  HPI Patient is a 59 year old white male here today for complete physical exam.  The last colonoscopy that I have on record for the patient was 2012.  He believes that he had another colonoscopy in 2017 with Dr. Elnoria Howard.  I do not have records of this so I will need to contact Dr. Haywood Pao office to verify.  However he is on a 5-year plan.  Therefore if he had a colonoscopy in 2017 he is still up-to-date.  He is due for prostate cancer screening with a PSA.  Patient had his Covid vaccination in May.  He will be due for a booster in January.  He has had the shingles vaccine.  He is due for a flu shot but he politely defers that today.  Otherwise he is doing well with no concerns.  He does have some mild pain in his right shoulder but he sees an orthopedist for that.  He is due for fasting lab work Past Medical History:  Diagnosis Date  . 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 Atherosclerosis of arteries   . Hyperlipidemia   . Obesity   . Thyroid disease    hypothyroidism   Past Surgical History:  Procedure Laterality Date  . CHOLECYSTECTOMY, LAPAROSCOPIC     Dr. Coralyn Pear  . CORNEAL TRANSPLANT     Fuch's dystorphy (bilateral)  . HERNIA REPAIR N/A    [employer] 05/25/2020   Current Outpatient Medications on File Prior to Visit  Medication Sig Dispense Refill  . aspirin 81 MG chewable tablet Chew 81 mg by mouth daily.    . clotrimazole-betamethasone (LOTRISONE) cream APPLY TO AFFECTED AREA TWICE A DAY 30 g 0  . fluorometholone (FML) 0.1 % ophthalmic suspension 1 drop every 4 (four) hours.    . fluticasone (FLONASE) 50 MCG/ACT nasal spray Place 2 sprays into both nostrils daily as needed for allergies or rhinitis. 16 g 11  . hydrocortisone-pramoxine (ANALPRAM HC) 2.5-1 % rectal cream Place 1 application rectally 3 (three) times daily. Please dispense (2) tubes 60 g 0  .  levothyroxine (SYNTHROID) 175 MCG tablet Take 1 tablet (175 mcg total) by mouth every other day. 45 tablet 0  . levothyroxine (SYNTHROID) 200 MCG tablet Take 1 tablet (200 mcg total) by mouth daily before breakfast. 90 tablet 0  . mometasone (ELOCON) 0.1 % cream Apply 1 application topically daily. 45 g 0  . Multiple Vitamins tablet Take 1 tablet by mouth.    Marland Kitchen omeprazole (PRILOSEC) 40 MG capsule Take 1 capsule (40 mg total) by mouth daily. (Patient not taking: Reported on 03/14/2019) 30 capsule 3  . simvastatin (ZOCOR) 20 MG tablet TAKE 1 TABLET AT BEDTIME 90 tablet 3  . traZODone (DESYREL) 50 MG tablet TAKE ONE-HALF (1/2) TO ONE TABLET AT BEDTIME AS NEEDED FOR SLEEP 90 tablet 3   No current facility-administered medications on file prior to visit.   Allergies  Allergen Reactions  . Other Diarrhea, Hives, Itching and Rash   Social History   Socioeconomic History  . Marital status: Married    Spouse name: Not on file  . Number of children: Not on file  . Years of education: Not on file  . Highest education level: Not on file  Occupational History  . Not on file  Tobacco Use  . Smoking status: Never Smoker  .  Smokeless tobacco: Never Used  Substance and Sexual Activity  . Alcohol use: No    Comment: Ocassional  . Drug use: No  . Sexual activity: Not on file  Other Topics Concern  . Not on file  Social History Narrative  . Not on file   Social Determinants of Health   Financial Resource Strain:   . Difficulty of Paying Living Expenses: Not on file  Food Insecurity:   . Worried About Programme researcher, broadcasting/film/video in the Last Year: Not on file  . Ran Out of Food in the Last Year: Not on file  Transportation Needs:   . Lack of Transportation (Medical): Not on file  . Lack of Transportation (Non-Medical): Not on file  Physical Activity:   . Days of Exercise per Week: Not on file  . Minutes of Exercise per Session: Not on file  Stress:   . Feeling of Stress : Not on file  Social  Connections:   . Frequency of Communication with Friends and Family: Not on file  . Frequency of Social Gatherings with Friends and Family: Not on file  . Attends Religious Services: Not on file  . Active Member of Clubs or Organizations: Not on file  . Attends Banker Meetings: Not on file  . Marital Status: Not on file  Intimate Partner Violence:   . Fear of Current or Ex-Partner: Not on file  . Emotionally Abused: Not on file  . Physically Abused: Not on file  . Sexually Abused: Not on file      Review of Systems  All other systems reviewed and are negative.      Objective:   Physical Exam Vitals reviewed.  Constitutional:      General: He is not in acute distress.    Appearance: He is well-developed. He is not diaphoretic.  HENT:     Head: Normocephalic and atraumatic.     Right Ear: Tympanic membrane, ear canal and external ear normal.     Left Ear: Tympanic membrane, ear canal and external ear normal.     Nose: Nose normal.     Mouth/Throat:     Pharynx: No oropharyngeal exudate.  Eyes:     General: No scleral icterus.       Right eye: No discharge.        Left eye: No discharge.     Conjunctiva/sclera: Conjunctivae normal.     Pupils: Pupils are equal, round, and reactive to light.  Neck:     Thyroid: No thyromegaly.     Vascular: No JVD.     Trachea: No tracheal deviation.  Cardiovascular:     Rate and Rhythm: Normal rate and regular rhythm.     Heart sounds: Normal heart sounds. No murmur heard.  No friction rub. No gallop.   Pulmonary:     Effort: Pulmonary effort is normal. No respiratory distress.     Breath sounds: Normal breath sounds. No stridor. No wheezing or rales.  Abdominal:     General: Bowel sounds are normal. There is no distension.     Palpations: Abdomen is soft. There is no mass.     Tenderness: There is no abdominal tenderness. There is no guarding or rebound.  Musculoskeletal:     Cervical back: Normal range of motion  and neck supple.  Lymphadenopathy:     Cervical: No cervical adenopathy.  Skin:    General: Skin is warm.     Coloration: Skin is not pale.  Findings: No erythema or rash.  Neurological:     Mental Status: He is alert and oriented to person, place, and time.     Cranial Nerves: No cranial nerve deficit.     Motor: No abnormal muscle tone.     Coordination: Coordination normal.     Deep Tendon Reflexes: Reflexes normal.  Psychiatric:        Behavior: Behavior normal.        Thought Content: Thought content normal.        Judgment: Judgment normal.   Patient has large varicose veins particularly in his right leg along his medial right thigh and his medial anterior right shin.  These are asymptomatic and do not hurt him.  He has excellent peripheral pulses.        Assessment & Plan:  General medical exam - Plan: CBC with Differential/Platelet, COMPLETE METABOLIC PANEL WITH GFR, Lipid panel  Pure hypercholesterolemia  Hypothyroidism, unspecified type - Plan: TSH  Allergy to alpha-gal  Prostate cancer screening - Plan: PSA  I recommended the patient receive a flu shot.  He defers that today.  If he changes his mind he is welcome to come in at any point will be glad to give it to him.  I recommended a booster on his Covid vaccine in January.  We will check a CBC today, CMP, fasting lipid panel, and screen for prostate cancer with a PSA.  His blood pressure today is excellent.  I did recommend 30 minutes a day 5 days a week of aerobic exercise.  Also recommended a low carbohydrate diet particularly avoiding starches.  The patient is active and has been working in his yard every day however I believe he may need to pick up the intensity of his exercise in order to achieve weight loss.  He is not drinking sodas or calorie rich fluids.  However he admits that he could do better and limiting his starch and carbohydrate intake.  I would like to see the patient ultimately at 200 pounds but I  would be very happy to see him lose 15 to 20 pounds over the next 6 months.

## 2020-05-29 LAB — CBC WITH DIFFERENTIAL/PLATELET
Absolute Monocytes: 583 cells/uL (ref 200–950)
Basophils Absolute: 52 cells/uL (ref 0–200)
Basophils Relative: 1.1 %
Eosinophils Absolute: 89 cells/uL (ref 15–500)
Eosinophils Relative: 1.9 %
HCT: 44.4 % (ref 38.5–50.0)
Hemoglobin: 15.1 g/dL (ref 13.2–17.1)
Lymphs Abs: 1180 cells/uL (ref 850–3900)
MCH: 32.1 pg (ref 27.0–33.0)
MCHC: 34 g/dL (ref 32.0–36.0)
MCV: 94.5 fL (ref 80.0–100.0)
MPV: 10 fL (ref 7.5–12.5)
Monocytes Relative: 12.4 %
Neutro Abs: 2797 cells/uL (ref 1500–7800)
Neutrophils Relative %: 59.5 %
Platelets: 235 10*3/uL (ref 140–400)
RBC: 4.7 10*6/uL (ref 4.20–5.80)
RDW: 12 % (ref 11.0–15.0)
Total Lymphocyte: 25.1 %
WBC: 4.7 10*3/uL (ref 3.8–10.8)

## 2020-05-29 LAB — COMPLETE METABOLIC PANEL WITH GFR
AG Ratio: 1.8 (calc) (ref 1.0–2.5)
ALT: 19 U/L (ref 9–46)
AST: 21 U/L (ref 10–35)
Albumin: 4.4 g/dL (ref 3.6–5.1)
Alkaline phosphatase (APISO): 59 U/L (ref 35–144)
BUN: 13 mg/dL (ref 7–25)
CO2: 26 mmol/L (ref 20–32)
Calcium: 9.4 mg/dL (ref 8.6–10.3)
Chloride: 103 mmol/L (ref 98–110)
Creat: 0.99 mg/dL (ref 0.70–1.25)
GFR, Est African American: 95 mL/min/{1.73_m2} (ref >=60)
GFR, Est Non African American: 82 mL/min/{1.73_m2} (ref >=60)
Globulin: 2.5 g/dL (calc) (ref 1.9–3.7)
Glucose, Bld: 97 mg/dL (ref 65–99)
Potassium: 5.4 mmol/L — ABNORMAL HIGH (ref 3.5–5.3)
Sodium: 139 mmol/L (ref 135–146)
Total Bilirubin: 0.7 mg/dL (ref 0.2–1.2)
Total Protein: 6.9 g/dL (ref 6.1–8.1)

## 2020-05-29 LAB — LIPID PANEL
Cholesterol: 164 mg/dL (ref <200)
HDL: 46 mg/dL (ref >=40)
LDL Cholesterol (Calc): 94 mg/dL (calc)
Non-HDL Cholesterol (Calc): 118 mg/dL (calc) (ref <130)
Total CHOL/HDL Ratio: 3.6 (calc) (ref <5.0)
Triglycerides: 140 mg/dL (ref <150)

## 2020-05-29 LAB — PSA: PSA: 0.5 ng/mL (ref <=4.0)

## 2020-05-29 LAB — TSH: TSH: 1.66 mIU/L (ref 0.40–4.50)

## 2020-07-20 ENCOUNTER — Other Ambulatory Visit: Payer: Self-pay | Admitting: Family Medicine

## 2020-07-22 ENCOUNTER — Other Ambulatory Visit: Payer: Self-pay

## 2020-07-27 ENCOUNTER — Other Ambulatory Visit: Payer: Self-pay

## 2020-07-29 ENCOUNTER — Telehealth: Payer: Self-pay

## 2020-07-29 NOTE — Telephone Encounter (Signed)
Brent from [employer] called for clarification on one of Pt medications

## 2020-10-13 DIAGNOSIS — Z20822 Contact with and (suspected) exposure to covid-19: Secondary | ICD-10-CM | POA: Diagnosis not present

## 2020-10-13 DIAGNOSIS — R0602 Shortness of breath: Secondary | ICD-10-CM | POA: Diagnosis not present

## 2020-10-13 DIAGNOSIS — R531 Weakness: Secondary | ICD-10-CM | POA: Diagnosis not present

## 2020-10-13 DIAGNOSIS — R0982 Postnasal drip: Secondary | ICD-10-CM | POA: Diagnosis not present

## 2020-10-18 ENCOUNTER — Other Ambulatory Visit: Payer: Self-pay | Admitting: Family Medicine

## 2020-10-25 ENCOUNTER — Other Ambulatory Visit: Payer: Self-pay

## 2020-10-25 ENCOUNTER — Ambulatory Visit (INDEPENDENT_AMBULATORY_CARE_PROVIDER_SITE_OTHER): Payer: BC Managed Care – PPO | Admitting: Family Medicine

## 2020-10-25 ENCOUNTER — Encounter: Payer: Self-pay | Admitting: Family Medicine

## 2020-10-25 VITALS — BP 134/78 | HR 68 | Temp 97.8°F | Ht 70.0 in | Wt 275.0 lb

## 2020-10-25 DIAGNOSIS — E039 Hypothyroidism, unspecified: Secondary | ICD-10-CM

## 2020-10-25 DIAGNOSIS — E7800 Pure hypercholesterolemia, unspecified: Secondary | ICD-10-CM

## 2020-10-25 DIAGNOSIS — M2550 Pain in unspecified joint: Secondary | ICD-10-CM | POA: Diagnosis not present

## 2020-10-25 DIAGNOSIS — H6981 Other specified disorders of Eustachian tube, right ear: Secondary | ICD-10-CM

## 2020-10-25 DIAGNOSIS — R3915 Urgency of urination: Secondary | ICD-10-CM

## 2020-10-25 DIAGNOSIS — H6991 Unspecified Eustachian tube disorder, right ear: Secondary | ICD-10-CM

## 2020-10-25 DIAGNOSIS — N3281 Overactive bladder: Secondary | ICD-10-CM

## 2020-10-25 LAB — URINALYSIS, ROUTINE W REFLEX MICROSCOPIC
Bilirubin Urine: NEGATIVE
Glucose, UA: NEGATIVE
Hgb urine dipstick: NEGATIVE
Ketones, ur: NEGATIVE
Leukocytes,Ua: NEGATIVE
Nitrite: NEGATIVE
Protein, ur: NEGATIVE
Specific Gravity, Urine: 1.015 (ref 1.001–1.03)
pH: 6.5 (ref 5.0–8.0)

## 2020-10-25 MED ORDER — AMOXICILLIN 875 MG PO TABS: 875.0000 mg | ORAL_TABLET | Freq: Two times a day (BID) | ORAL | 0 refills | Status: DC

## 2020-10-25 MED ORDER — LEVOTHYROXINE SODIUM 175 MCG PO TABS: ORAL_TABLET | ORAL | 3 refills | Status: DC

## 2020-10-25 MED ORDER — SOLIFENACIN SUCCINATE 10 MG PO TABS: 10.0000 mg | ORAL_TABLET | Freq: Every day | ORAL | 1 refills | Status: DC

## 2020-10-25 NOTE — Progress Notes (Signed)
Subjective:    Patient ID: Juan Stone, male    DOB: 1961-02-19, 60 y.o.   MRN: [id number]  HPI Patient presents today with several concerns.  First he reports pain and weakness in his legs.  When asked the patient to be more specific he complains of pain in both knees.  He also reports weakness in the muscles around his knees.  He also complains of pain in the posterior aspects of both hips.  He complains of weakness and tenderness in his hip extensors as well as his knee extensor muscles.  He is also starting to develop pain in both ankles.  He denies any rash.  There is no redness or swelling in any of the affected joints.  He denies any pain in his shoulders and his elbows or in his wrist or hands.  He has no history of any autoimmune process.  He is on a statin which he has been on for quite some time.  He is due to check his thyroid.  He is supposed to be alternating 200 mcg of levothyroxine daily with 175 mcg of levothyroxine.  However he is only been taking 200 mcg because he ran out of the 175.  Therefore anticipate that his TSH will be low but he is due to recheck that.  He also complains of urinary frequency and urgency.  He states that he will be driving and suddenly have the urge to urinate.  He only has a brief period of time before he has to get to the bathroom or otherwise he will have the urge incontinence.  The sensation seem to hit him out of the blue without warning.  He denies any dysuria or hematuria or pelvic pain.  Urinalysis today is normal.  PSA in September was 0.5.  He denies any nocturia.  He denies any hesitancy.  He denies any weak stream.  He also complains of pressure in his right ear and decreased hearing in his right ear.  On examination, the tympanic membrane is bulging with what appears to be a middle ear effusion.  There is no erythema however.  He denies any sinus pain.  He is taking Flonase. Past Medical History:  Diagnosis Date  . 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 Atherosclerosis of arteries   . Hyperlipidemia   . Obesity   . Thyroid disease    hypothyroidism   Past Surgical History:  Procedure Laterality Date  . CHOLECYSTECTOMY, LAPAROSCOPIC     Dr. Fausto Skillern  . CORNEAL TRANSPLANT     Fuch's dystorphy (bilateral)  . HERNIA REPAIR N/A    [employer] 05/25/2020   Current Outpatient Medications on File Prior to Visit  Medication Sig Dispense Refill  . aspirin 81 MG chewable tablet Chew 81 mg by mouth daily.    . clotrimazole-betamethasone (LOTRISONE) cream APPLY TO AFFECTED AREA TWICE A DAY 30 g 0  . fluorometholone (FML) 0.1 % ophthalmic suspension 1 drop every 4 (four) hours.    . fluticasone (FLONASE) 50 MCG/ACT nasal spray Place 2 sprays into both nostrils daily as needed for allergies or rhinitis. 16 g 11  . hydrocortisone-pramoxine (ANALPRAM HC) 2.5-1 % rectal cream Place 1 application rectally 3 (three) times daily. Please dispense (2) tubes 60 g 0  . levothyroxine (SYNTHROID) 200 MCG tablet TAKE 1 TABLET DAILY BEFORE BREAKFAST 90 tablet 3  . mometasone (ELOCON) 0.1 % cream Apply 1 application topically daily. 45 g 0  .  Multiple Vitamins tablet Take 1 tablet by mouth.    Marland Kitchen omeprazole (PRILOSEC) 40 MG capsule Take 1 capsule (40 mg total) by mouth daily. (Patient not taking: Reported on 03/14/2019) 30 capsule 3  . simvastatin (ZOCOR) 20 MG tablet TAKE 1 TABLET AT BEDTIME 90 tablet 3  . traZODone (DESYREL) 50 MG tablet TAKE ONE-HALF (1/2) TO ONE TABLET AT BEDTIME AS NEEDED FOR SLEEP 90 tablet 3   No current facility-administered medications on file prior to visit.   Allergies  Allergen Reactions  . Other Diarrhea, Hives, Itching and Rash   Social History   Socioeconomic History  . Marital status: Married    Spouse name: Not on file  . Number of children: Not on file  . Years of education: Not on file  . Highest education level: Not on file  Occupational History  . Not on file  Tobacco Use   . Smoking status: Never Smoker  . Smokeless tobacco: Never Used  Substance and Sexual Activity  . Alcohol use: No    Comment: Ocassional  . Drug use: No  . Sexual activity: Not on file  Other Topics Concern  . Not on file  Social History Narrative  . Not on file   Social Determinants of Health   Financial Resource Strain: Not on file  Food Insecurity: Not on file  Transportation Needs: Not on file  Physical Activity: Not on file  Stress: Not on file  Social Connections: Not on file  Intimate Partner Violence: Not on file      Review of Systems  All other systems reviewed and are negative.      Objective:   Physical Exam Vitals reviewed.  Constitutional:      General: He is not in acute distress.    Appearance: He is well-developed. He is not diaphoretic.  HENT:     Head: Normocephalic and atraumatic.     Right Ear: Ear canal and external ear normal. A middle ear effusion is present. Tympanic membrane is bulging. Tympanic membrane is not injected, scarred, perforated or erythematous.     Left Ear: Tympanic membrane, ear canal and external ear normal.     Nose: Nose normal.     Mouth/Throat:     Pharynx: No oropharyngeal exudate.  Eyes:     General: No scleral icterus.       Right eye: No discharge.        Left eye: No discharge.     Conjunctiva/sclera: Conjunctivae normal.     Pupils: Pupils are equal, round, and reactive to light.  Neck:     Thyroid: No thyromegaly.     Vascular: No JVD.     Trachea: No tracheal deviation.  Cardiovascular:     Rate and Rhythm: Normal rate and regular rhythm.     Heart sounds: Normal heart sounds. No murmur heard. No friction rub. No gallop.   Pulmonary:     Effort: Pulmonary effort is normal. No respiratory distress.     Breath sounds: Normal breath sounds. No stridor. No wheezing or rales.  Abdominal:     General: Bowel sounds are normal. There is no distension.     Palpations: Abdomen is soft. There is no mass.      Tenderness: There is no abdominal tenderness. There is no guarding or rebound.  Musculoskeletal:     Cervical back: Normal range of motion and neck supple.  Lymphadenopathy:     Cervical: No cervical adenopathy.  Skin:    General:  Skin is warm.     Coloration: Skin is not pale.     Findings: No erythema or rash.  Neurological:     Mental Status: He is alert and oriented to person, place, and time.     Cranial Nerves: No cranial nerve deficit.     Motor: No abnormal muscle tone.     Coordination: Coordination normal.     Deep Tendon Reflexes: Reflexes normal.  Psychiatric:        Behavior: Behavior normal.        Thought Content: Thought content normal.        Judgment: Judgment normal.         Assessment & Plan:  Urgency of urination - Plan: Urine Culture, Urinalysis, Routine w reflex microscopic  Polyarthralgia - Plan: CK, Sedimentation rate  Hypothyroidism, unspecified type - Plan: TSH, COMPLETE METABOLIC PANEL WITH GFR  Pure hypercholesterolemia - Plan: Lipid panel, COMPLETE METABOLIC PANEL WITH GFR, CANCELED: CMP14+EGFR  OAB (overactive bladder)  Eustachian tube dysfunction, right  Urinalysis is negative.  I believe the patient most likely has overactive bladder.  Try Vesicare 10 mg a day and reassess in 2 to 3 weeks.  I believe he is having eustachian tube dysfunction in his right ear.  He states in the past that this is always responded to antibiotics.  I will try amoxicillin 875 mg twice daily for 10 days however it does not appear to be infected but rather due to middle ear effusion.  If not improving he will need to see ENT for possible tympanostomy tube placement.  Third I believe that the polyarthralgias could be due to his statin use.  I recommended that he stop simvastatin.  Check a CK level and a sed rate to rule out autoimmune diseases as well as myopathy.  Regarding his cholesterol check a fasting lipid panel.  Ideally I like his LDL to be less than 100.   Regarding his hypothyroidism check a TSH however I suspect that it will be low due to the fact he is only been taking 200 mcg a day.  Therefore I refilled his 175 mcg and asked him to resume his previous pattern.  Patient was taking 200 mcg daily alternating with 175 mcg daily

## 2020-10-26 DIAGNOSIS — L570 Actinic keratosis: Secondary | ICD-10-CM | POA: Diagnosis not present

## 2020-10-26 DIAGNOSIS — L814 Other melanin hyperpigmentation: Secondary | ICD-10-CM | POA: Diagnosis not present

## 2020-10-26 DIAGNOSIS — L819 Disorder of pigmentation, unspecified: Secondary | ICD-10-CM | POA: Diagnosis not present

## 2020-10-26 DIAGNOSIS — D485 Neoplasm of uncertain behavior of skin: Secondary | ICD-10-CM | POA: Diagnosis not present

## 2020-10-26 DIAGNOSIS — D229 Melanocytic nevi, unspecified: Secondary | ICD-10-CM | POA: Diagnosis not present

## 2020-10-26 DIAGNOSIS — B079 Viral wart, unspecified: Secondary | ICD-10-CM | POA: Diagnosis not present

## 2020-10-26 DIAGNOSIS — L821 Other seborrheic keratosis: Secondary | ICD-10-CM | POA: Diagnosis not present

## 2020-10-26 LAB — COMPLETE METABOLIC PANEL WITH GFR
AG Ratio: 1.5 (calc) (ref 1.0–2.5)
ALT: 21 U/L (ref 9–46)
AST: 25 U/L (ref 10–35)
Albumin: 4.3 g/dL (ref 3.6–5.1)
Alkaline phosphatase (APISO): 68 U/L (ref 35–144)
BUN: 14 mg/dL (ref 7–25)
CO2: 25 mmol/L (ref 20–32)
Calcium: 9.6 mg/dL (ref 8.6–10.3)
Chloride: 100 mmol/L (ref 98–110)
Creat: 0.95 mg/dL (ref 0.70–1.25)
GFR, Est African American: 99 mL/min/{1.73_m2} (ref >=60)
GFR, Est Non African American: 85 mL/min/{1.73_m2} (ref >=60)
Globulin: 2.8 g/dL (calc) (ref 1.9–3.7)
Glucose, Bld: 82 mg/dL (ref 65–99)
Potassium: 5 mmol/L (ref 3.5–5.3)
Sodium: 137 mmol/L (ref 135–146)
Total Bilirubin: 0.6 mg/dL (ref 0.2–1.2)
Total Protein: 7.1 g/dL (ref 6.1–8.1)

## 2020-10-26 LAB — LIPID PANEL
Cholesterol: 181 mg/dL (ref <200)
HDL: 48 mg/dL (ref >=40)
LDL Cholesterol (Calc): 106 mg/dL (calc) — ABNORMAL HIGH
Non-HDL Cholesterol (Calc): 133 mg/dL (calc) — ABNORMAL HIGH (ref <130)
Total CHOL/HDL Ratio: 3.8 (calc) (ref <5.0)
Triglycerides: 158 mg/dL — ABNORMAL HIGH (ref <150)

## 2020-10-26 LAB — URINE CULTURE
MICRO NUMBER:: 11502719
SPECIMEN QUALITY:: ADEQUATE

## 2020-10-26 LAB — SEDIMENTATION RATE: Sed Rate: 2 mm/h (ref 0–20)

## 2020-10-26 LAB — CK: Total CK: 139 U/L (ref 44–196)

## 2020-10-26 LAB — TSH: TSH: 12.24 mIU/L — ABNORMAL HIGH (ref 0.40–4.50)

## 2020-12-10 ENCOUNTER — Encounter: Payer: Self-pay | Admitting: Family Medicine

## 2020-12-10 MED ORDER — SOLIFENACIN SUCCINATE 10 MG PO TABS: 10.0000 mg | ORAL_TABLET | Freq: Every day | ORAL | 1 refills | Status: DC

## 2021-01-07 DIAGNOSIS — Z947 Corneal transplant status: Secondary | ICD-10-CM | POA: Diagnosis not present

## 2021-01-07 DIAGNOSIS — Z961 Presence of intraocular lens: Secondary | ICD-10-CM | POA: Diagnosis not present

## 2021-03-28 ENCOUNTER — Other Ambulatory Visit: Payer: Self-pay | Admitting: Orthopedic Surgery

## 2021-03-28 DIAGNOSIS — M19011 Primary osteoarthritis, right shoulder: Secondary | ICD-10-CM | POA: Diagnosis not present

## 2021-03-28 DIAGNOSIS — S46011A Strain of muscle(s) and tendon(s) of the rotator cuff of right shoulder, initial encounter: Secondary | ICD-10-CM | POA: Diagnosis not present

## 2021-03-28 DIAGNOSIS — M25511 Pain in right shoulder: Secondary | ICD-10-CM

## 2021-04-03 ENCOUNTER — Other Ambulatory Visit: Payer: BLUE CROSS/BLUE SHIELD

## 2021-04-09 ENCOUNTER — Other Ambulatory Visit: Payer: Self-pay

## 2021-04-09 ENCOUNTER — Ambulatory Visit
Admission: RE | Admit: 2021-04-09 | Discharge: 2021-04-09 | Disposition: A | Discharge status: Home / Self Care | Payer: BC Managed Care – PPO | Source: Ambulatory Visit | Attending: Orthopedic Surgery | Admitting: Orthopedic Surgery

## 2021-04-09 DIAGNOSIS — M25511 Pain in right shoulder: Secondary | ICD-10-CM | POA: Diagnosis not present

## 2021-04-10 ENCOUNTER — Encounter: Payer: Self-pay | Admitting: Family Medicine

## 2021-04-11 ENCOUNTER — Encounter: Payer: Self-pay | Admitting: Family Medicine

## 2021-04-11 ENCOUNTER — Other Ambulatory Visit: Payer: Self-pay | Admitting: *Deleted

## 2021-04-11 DIAGNOSIS — E039 Hypothyroidism, unspecified: Secondary | ICD-10-CM

## 2021-04-12 ENCOUNTER — Other Ambulatory Visit: Payer: Self-pay

## 2021-04-12 ENCOUNTER — Other Ambulatory Visit: Payer: BC Managed Care – PPO

## 2021-04-12 DIAGNOSIS — E039 Hypothyroidism, unspecified: Secondary | ICD-10-CM

## 2021-04-13 LAB — TSH: TSH: 8.7 mIU/L — ABNORMAL HIGH (ref 0.40–4.50)

## 2021-04-14 ENCOUNTER — Other Ambulatory Visit: Payer: Self-pay | Admitting: Family Medicine

## 2021-04-14 DIAGNOSIS — G4733 Obstructive sleep apnea (adult) (pediatric): Secondary | ICD-10-CM | POA: Diagnosis not present

## 2021-04-14 MED ORDER — LEVOTHYROXINE SODIUM 25 MCG PO TABS: 25.0000 ug | ORAL_TABLET | Freq: Every day | ORAL | 3 refills | Status: DC

## 2021-04-14 MED ORDER — LEVOTHYROXINE SODIUM 200 MCG PO TABS: 200.0000 ug | ORAL_TABLET | Freq: Every day | ORAL | 3 refills | Status: DC

## 2021-05-13 ENCOUNTER — Encounter: Payer: Self-pay | Admitting: Family Medicine

## 2021-05-13 MED ORDER — SOLIFENACIN SUCCINATE 10 MG PO TABS: 10.0000 mg | ORAL_TABLET | Freq: Every day | ORAL | 1 refills | Status: DC

## 2021-07-12 ENCOUNTER — Other Ambulatory Visit: Payer: Self-pay

## 2021-07-12 ENCOUNTER — Other Ambulatory Visit: Payer: BC Managed Care – PPO

## 2021-07-12 DIAGNOSIS — E039 Hypothyroidism, unspecified: Secondary | ICD-10-CM

## 2021-07-13 LAB — TSH: TSH: 0.06 mIU/L — ABNORMAL LOW (ref 0.40–4.50)

## 2021-07-15 ENCOUNTER — Other Ambulatory Visit: Payer: Self-pay | Admitting: *Deleted

## 2021-07-15 DIAGNOSIS — E039 Hypothyroidism, unspecified: Secondary | ICD-10-CM

## 2021-07-15 MED ORDER — LEVOTHYROXINE SODIUM 200 MCG PO TABS: 200.0000 ug | ORAL_TABLET | Freq: Every day | ORAL | 3 refills | Status: DC

## 2021-07-21 ENCOUNTER — Other Ambulatory Visit: Payer: Self-pay

## 2021-07-21 ENCOUNTER — Encounter: Payer: Self-pay | Admitting: Nurse Practitioner

## 2021-07-21 ENCOUNTER — Ambulatory Visit (INDEPENDENT_AMBULATORY_CARE_PROVIDER_SITE_OTHER): Payer: BC Managed Care – PPO | Admitting: Nurse Practitioner

## 2021-07-21 DIAGNOSIS — R062 Wheezing: Secondary | ICD-10-CM

## 2021-07-21 DIAGNOSIS — J069 Acute upper respiratory infection, unspecified: Secondary | ICD-10-CM

## 2021-07-21 MED ORDER — PREDNISONE 20 MG PO TABS: 40.0000 mg | ORAL_TABLET | Freq: Every day | ORAL | 0 refills | Status: AC

## 2021-07-21 NOTE — Progress Notes (Signed)
Subjective:    Patient ID: Juan Stone, male    DOB: 18-Nov-1960, 60 y.o.   MRN: [id number]  HPI: Juan Stone is a 60 y.o. male presenting virtually for cough.   Chief Complaint  Patient presents with   URI   UPPER RESPIRATORY TRACT INFECTION Onset: 10/31 COVID-19 testing history: negative x 3 at home Fever: yes; 100 Body aches: yes Chills: yes Cough: yes; productive at times Shortness of breath: yes Wheezing: yes Chest pain: yes; with cough Chest tightness: no Chest congestion: yes Nasal congestion: yes Runny nose: yes Post nasal drip: yes Sneezing: yes Sore throat: no Swollen glands: no Sinus pressure: no Headache: yes Face pain: no Toothache: no Ear pain: no  Ear pressure: no  Eyes red/itching: no Eye drainage/crusting: yes; watering  Nausea: no  Vomiting: no Diarrhea: no  Change in appetite: no  Loss of taste/smell: no  Rash: no Fatigue: yes Sick contacts: no Strep contacts: no  Context: worse Recurrent sinusitis: no Treatments attempted: Mucinex, ibuprofen, night time cold/flu, dextromethophan, doxycyclamine cyclate  Relief with OTC medications: no  Allergies  Allergen Reactions   Other Diarrhea, Hives, Itching and Rash    Outpatient Encounter Medications as of 07/21/2021  Medication Sig Note   predniSONE (DELTASONE) 20 MG tablet Take 2 tablets (40 mg total) by mouth daily with breakfast for 5 days.    clotrimazole-betamethasone (LOTRISONE) cream APPLY TO AFFECTED AREA TWICE A DAY    fluorometholone (FML) 0.1 % ophthalmic suspension 1 drop every 4 (four) hours.    fluticasone (FLONASE) 50 MCG/ACT nasal spray Place 2 sprays into both nostrils daily as needed for allergies or rhinitis.    hydrocortisone-pramoxine (ANALPRAM HC) 2.5-1 % rectal cream Place 1 application rectally 3 (three) times daily. Please dispense (2) tubes    levothyroxine (SYNTHROID) 200 MCG tablet Take 1 tablet (200 mcg total) by mouth daily before breakfast.    mometasone  (ELOCON) 0.1 % cream Apply 1 application topically daily.    Multiple Vitamins tablet Take 1 tablet by mouth. 08/28/2014: Received from: [facility]   simvastatin (ZOCOR) 20 MG tablet TAKE 1 TABLET AT BEDTIME    solifenacin (VESICARE) 10 MG tablet Take 1 tablet (10 mg total) by mouth daily.    traZODone (DESYREL) 50 MG tablet TAKE ONE-HALF (1/2) TO ONE TABLET AT BEDTIME AS NEEDED FOR SLEEP    [DISCONTINUED] amoxicillin (AMOXIL) 875 MG tablet Take 1 tablet (875 mg total) by mouth 2 (two) times daily.    [DISCONTINUED] aspirin 81 MG chewable tablet Chew 81 mg by mouth daily.    [DISCONTINUED] omeprazole (PRILOSEC) 40 MG capsule Take 1 capsule (40 mg total) by mouth daily. (Patient not taking: Reported on 03/14/2019)    No facility-administered encounter medications on file as of 07/21/2021.    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 Interim medical history since our last visit reviewed.  Review of Systems Per HPI unless specifically indicated above     Objective:    There were no  vitals taken for this visit.  Wt Readings from Last 3 Encounters:  10/25/20 275 lb (124.7 kg)  05/28/20 270 lb (122.5 kg)  03/14/19 268 lb (121.6 kg)    Physical Exam Physical examination unable to be performed today due to lack of equipment.  Patient talking in complete sentences during telemedicine visit.  Occasional dry sounding cough appreciated.    Assessment & Plan:  1. Upper respiratory tract infection, unspecified type Acute.  Suspect viral illness.  Offered viral testing,  however patient declined.  Reassured patient that symptoms are most consistent with a viral upper respiratory infection and explained lack of efficacy of antibiotics against viruses.  Discussed expected course and features suggestive of secondary bacterial infection.  Continue supportive care. Increase fluid intake with water or electrolyte solution like pedialyte. Encouraged acetaminophen as needed for fever/pain. Encouraged salt water gargling, chloraseptic spray and throat lozenges. Encouraged OTC guaifenesin. Encouraged saline sinus flushes and/or neti with humidified air.  Follow up if symptoms are not improving early next week.   2. Wheezing Acute.  No history of reactive lung disease known.  Start prednisone 40 mg x 5 days.  Return to clinic if symptoms persist and/or seek urgent care if they worsen.  - predniSONE (DELTASONE) 20 MG tablet; Take 2 tablets (40 mg total) by mouth daily with breakfast for 5 days.  Dispense: 10 tablet; Refill: 0   Follow up plan: Return if symptoms worsen or fail to improve.   This visit was completed via telephone due to the restrictions of the COVID-19 pandemic. All issues as above were discussed and addressed but no physical exam was performed. If it was felt that the patient should be evaluated in the office, they were directed there. The patient verbally consented to this visit. Patient was unable to complete an audio/visual visit due to lack of equipment. Location of the patient: home Location of the provider: work Those involved with this call:  Provider: Cathlean Marseilles, DNP, FNP-C CMA: n/a Front Desk/Registration: Claudine Mouton  Time spent on call:  14 minutes on the phone discussing health concerns. 20 minutes total spent in review of patient's record and preparation of their chart. I verified patient identity using two factors (patient name and date of birth). Patient consents verbally to being seen via telemedicine visit today.

## 2021-07-22 DIAGNOSIS — G4733 Obstructive sleep apnea (adult) (pediatric): Secondary | ICD-10-CM | POA: Diagnosis not present

## 2021-07-29 ENCOUNTER — Telehealth: Payer: Self-pay

## 2021-07-29 ENCOUNTER — Other Ambulatory Visit: Payer: Self-pay | Admitting: Nurse Practitioner

## 2021-07-29 ENCOUNTER — Encounter: Payer: Self-pay | Admitting: Nurse Practitioner

## 2021-07-29 MED ORDER — AMOXICILLIN-POT CLAVULANATE 875-125 MG PO TABS: 1.0000 | ORAL_TABLET | Freq: Two times a day (BID) | ORAL | 0 refills | Status: DC

## 2021-07-29 NOTE — Telephone Encounter (Signed)
Pt called in stating that he has sent you a message in MyChart that he is still not feeling that well, and would like for the NP to give a call back about sending some meds to pharmacy for his cough.  Please advise.  Cb#: [phone]

## 2021-07-29 NOTE — Telephone Encounter (Signed)
I have responded to [employer] and sent in medication

## 2021-08-21 DIAGNOSIS — G4733 Obstructive sleep apnea (adult) (pediatric): Secondary | ICD-10-CM | POA: Diagnosis not present

## 2021-08-25 DIAGNOSIS — M66821 Spontaneous rupture of other tendons, right upper arm: Secondary | ICD-10-CM | POA: Diagnosis not present

## 2021-08-25 DIAGNOSIS — M948X1 Other specified disorders of cartilage, shoulder: Secondary | ICD-10-CM | POA: Diagnosis not present

## 2021-08-25 DIAGNOSIS — M7541 Impingement syndrome of right shoulder: Secondary | ICD-10-CM | POA: Diagnosis not present

## 2021-08-25 DIAGNOSIS — M94211 Chondromalacia, right shoulder: Secondary | ICD-10-CM | POA: Diagnosis not present

## 2021-08-25 DIAGNOSIS — M24111 Other articular cartilage disorders, right shoulder: Secondary | ICD-10-CM | POA: Diagnosis not present

## 2021-08-25 DIAGNOSIS — M7521 Bicipital tendinitis, right shoulder: Secondary | ICD-10-CM | POA: Diagnosis not present

## 2021-08-25 DIAGNOSIS — M19011 Primary osteoarthritis, right shoulder: Secondary | ICD-10-CM | POA: Diagnosis not present

## 2021-08-25 DIAGNOSIS — G8918 Other acute postprocedural pain: Secondary | ICD-10-CM | POA: Diagnosis not present

## 2021-08-25 DIAGNOSIS — M7551 Bursitis of right shoulder: Secondary | ICD-10-CM | POA: Diagnosis not present

## 2021-08-25 DIAGNOSIS — M75111 Incomplete rotator cuff tear or rupture of right shoulder, not specified as traumatic: Secondary | ICD-10-CM | POA: Diagnosis not present

## 2021-09-01 ENCOUNTER — Other Ambulatory Visit: Payer: BC Managed Care – PPO

## 2021-09-01 ENCOUNTER — Other Ambulatory Visit: Payer: Self-pay

## 2021-09-01 DIAGNOSIS — E039 Hypothyroidism, unspecified: Secondary | ICD-10-CM | POA: Diagnosis not present

## 2021-09-01 DIAGNOSIS — M19011 Primary osteoarthritis, right shoulder: Secondary | ICD-10-CM | POA: Diagnosis not present

## 2021-09-02 LAB — TSH: TSH: 4.05 mIU/L (ref 0.40–4.50)

## 2021-09-05 ENCOUNTER — Ambulatory Visit (HOSPITAL_COMMUNITY): Payer: BC Managed Care – PPO | Attending: Orthopedic Surgery | Admitting: Occupational Therapy

## 2021-09-05 ENCOUNTER — Encounter (HOSPITAL_COMMUNITY): Payer: Self-pay | Admitting: Occupational Therapy

## 2021-09-05 ENCOUNTER — Other Ambulatory Visit: Payer: Self-pay

## 2021-09-05 DIAGNOSIS — R29898 Other symptoms and signs involving the musculoskeletal system: Secondary | ICD-10-CM

## 2021-09-05 DIAGNOSIS — M25611 Stiffness of right shoulder, not elsewhere classified: Secondary | ICD-10-CM | POA: Diagnosis not present

## 2021-09-05 DIAGNOSIS — M25511 Pain in right shoulder: Secondary | ICD-10-CM

## 2021-09-05 NOTE — Patient Instructions (Addendum)
1) SHOULDER: Flexion On Table   Place hands on towel placed on table, elbows straight. Lean forward with you upper body, pushing towel away from body.  __10 reps per set, _2 to 3 sets per day  2) Abduction (Passive)   With arm out to side, resting on towel placed on table with palm DOWN, keeping trunk away from table, lean to the side while pushing towel away from body.  Repeat ____ times. Do ____ sessions per day.  Copyright  VHI. All rights reserved.     3) Internal Rotation (Assistive) Told not to do this one.   Seated with elbow bent at right angle and held against side, slide arm on table surface in an inward arc keeping elbow anchored in place. Repeat ____ times. Do ____ sessions per day. Activity: Use this motion to brush crumbs off the table.  Copyright  VHI. All rights reserved.

## 2021-09-05 NOTE — Therapy (Signed)
[facility] [address] [place], [place], [address] Phone: [phone]   Fax:  [phone]  Occupational Therapy Evaluation  Patient Details  Name: Juan Stone MRN: [id number] Date of Birth: 1960-12-27 Referring Provider (OT): Elana Alm. Thurston Hole, M.D.   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 Coralyn Pear   CORNEAL TRANSPLANT     Fuch's dystorphy (bilateral)   HERNIA REPAIR N/A    Phreesia 05/25/2020    There were no vitals filed for this visit.   Subjective Assessment - 09/05/21 0818     Subjective  S: Pt reports wanting ROM and strength back in arm. Pt reports increase to 7/10 pain in R shoulder during P/ROM.    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 R shoulder impingement. 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    Patient Stated Goals Get the range of motion back in this arm.    Currently in Pain? Yes    Pain Score 4     Pain Location Shoulder    Pain Orientation Right    Pain Descriptors / Indicators Aching;Sharp    Pain  Type Surgical pain    Pain Radiating Towards No radiating    Pain Onset In the past 7 days    Pain Frequency Constant    Aggravating Factors  Position of the arm. Movement.    Pain Relieving Factors Tylenol only recently    Effect of Pain on Daily Activities Maximal               [facility] OT Assessment - 09/05/21 0001       Assessment   Medical Diagnosis s/p R shoulder bicepts tenodieis, rotator cuff, partial labrum and partial bicepts tendon tear debridement, subacromial decompression, R shoulder DCE. Opertaion on 08/25/21    Referring Provider (OT) Elana Alm. Thurston Hole, M.D.    Onset Date/Surgical Date 08/25/21    Hand Dominance Right    Next MD Visit Pt reported around January 5th or so.    Prior Therapy no prior therapy      Precautions   Precautions Shoulder    Type of Shoulder Precautions Passive range of motion only of shoulder and his elbow for 5 weeks post op. Sling for six weeks post op. Pt reported operation took place on 08/25/21.    Shoulder Interventions Shoulder sling/immobilizer  Precaution Booklet Issued No      Restrictions   Weight Bearing Restrictions No    Other Position/Activity Restrictions None listed in precautions. P/ROM only of shoulder and elbow for first 5 weeks.      Balance Screen   Has the patient fallen in the past 6 months No    Has the patient had a decrease in activity level because of a fear of falling?  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    Leisure [place], [place].      ADL   Equipment Used --   R shoulder sling   ADL comments Pt reports he can do all ADL's but it takes more time and is more painful. Pt reports that he is having difficulty getting a full nights sleep due to the shoulder. Wakes up about every 3 to 4 hours.      IADL   Prior Level of Function Shopping  Independent      Mobility   Mobility Status Independent      Written Expression   Dominant Hand Right      Observation/Other Assessments   Focus on Therapeutic Outcomes (FOTO)  40.7      ROM / Strength   AROM / PROM / Strength PROM      Palpation   Palpation comment Assessing fascial restructions which were min to moderate with tenderness in inferior scapula region.      PROM   Overall PROM  Deficits    Overall PROM Comments supine adducted    PROM Assessment Site Shoulder    Right/Left Shoulder Right    Right Shoulder Flexion 86 Degrees    Right Shoulder ABduction 66 Degrees    Right Shoulder Internal Rotation --   WFL; against body   Right Shoulder External Rotation 30 Degrees                      OT Treatments/Exercises (OP) - 09/05/21 0001       Exercises   Exercises Shoulder      Shoulder Exercises: Seated   Other Seated Exercises table stretch flexion, abduction x10 reps      Manual Therapy   Manual Therapy Myofascial release    Manual therapy comments Myofascial release manual therapy completed seperately from all other interventions.    Myofascial Release myofascial relsaes and manual stretching to R upper arm, scapular, and shoulder region to decrease pain and restrictions and improve pain free mobility. Assessing fascial restructions which were min to moderate with tenderness in inferior scapula region.                    OT Education - 09/05/21 1133     Education Details 12/19: Towel slides flexion and abduction    Person(s) Educated Patient    Methods Explanation;Demonstration;Verbal cues    Comprehension Verbalized understanding;Returned demonstration;Verbal cues required              OT Short Term Goals - 09/05/21 1140       OT SHORT TERM GOAL #1   Title Pt will be provided with and educated on HEP to improve mobility in RUE required for ADL completion.    Time 6    Period Weeks    Status New    Target  Date 10/17/21       OT SHORT TERM GOAL #2   Title Pt will decrease RUE fascial restrictions to minimal amounts or less to improve mobility required for overhead reaching tasks.    Time 6    Period Weeks    Status New    Target Date 10/17/21      OT SHORT TERM GOAL #3   Title Pt will decrease pain in RUE to 3/10 or less in order to sleep for 4 to 5+ consecutive hours without waking due to pain 75% of data opporutnieis.    Time 6    Period Weeks    Status New    Target Date 10/17/21      OT SHORT TERM GOAL #4   Title Patient will improve R UE P/ROM to [facility] in order to improve mobility for functional reaching tasks that are above shoulder height.    Time 6    Period Weeks    Status New    Target Date 03/06/22               OT Long Term Goals - 09/05/21 1145       OT LONG TERM GOAL #1   Title Patient will report using his R UE for ADL tasks including active ROM with 2/10 pain or less 75% of data opportunities.    Time 12    Period Weeks    Status New    Target Date 11/28/21      OT LONG TERM GOAL #2   Title Pt will increase A/ROM of RUE to [facility] to improve ability to reach overhead and behind back during dressing and bathing tasks.    Time 12    Period Weeks    Status New    Target Date 11/28/21      OT LONG TERM GOAL #3   Title Pt will increase strength in RUE to 4+/5 to improve ability to perform lifting tasks required for work and daily activities.    Time 12    Period Weeks    Status New    Target Date 11/28/20                   Plan - 09/05/21 0912     Clinical Impression Statement A: Pt is a 60 year old male presenting with R shoulder postoperative diagnosis of traumatic partial rotator cuff tear, partial labrum tear, partial biceps tendon tear, rt shoulder impingement, rt shoulder AC joint DJD and impingement with 08/25/21 R shoulder operation consisting of biceps tenodiesis, partial rotator cuff, partial labrum, and partial biceps tendon tear debridement,  subacromial decompression, and R shoulder DCE.Pt is currently in a shoulder sling for 6 weeks post operation. Pt is limited in functional use of R UE. P reports ability to complete ADL's but with pain and extended time. Pt is limited to below 50% for P/ROM at this time for flexion and abudction. Pt reports being able to sleep 3 to 4 hours without waking due to R shoulder pain and discomfort.    OT Occupational Profile and History Problem Focused Assessment - Including review of records relating to presenting problem    Occupational performance deficits (Please refer to evaluation for details): ADL's;IADL's;Rest and Sleep;Work;Leisure    Body Structure / Function / Physical Skills ADL;Endurance;Fascial restriction;ROM;Pain;Strength;IADL;UE functional use    Rehab Potential Good    Clinical Decision Making Limited treatment options, no task modification necessary    Comorbidities Affecting Occupational Performance: Presence of  comorbidities impacting occupational performance    Comorbidities impacting occupational performance description: obesity    Modification or Assistance to Complete Evaluation  No modification of tasks or assist necessary to complete eval    OT Frequency 2x / week    OT Duration 12 weeks    OT Treatment/Interventions Self-care/ADL training;Electrical Stimulation;Cryotherapy;Moist Heat;Ultrasound;Therapeutic exercise;Manual Therapy;Therapeutic activities;Patient/family education    Plan P: Pt will benefit from skilled OT services to decrease pain and fascial restricutions, increase joint ROM, strength, and functional use of R UE during daily tasks. Treatment plan: myofascial release, manual techniques, P/ROM, AA/ROM, A/ROM, general R UE strengthening, scapular mobility/stability/strengthening, modalities prn. P/ROM only for first 6 weeks.    OT Home Exercise Plan 09/05/21: table slides flexion and abduction    Consulted and Agree with Plan of Care Patient             Patient  will benefit from skilled therapeutic intervention in order to improve the following deficits and impairments:   Body Structure / Function / Physical Skills: ADL, Endurance, Fascial restriction, ROM, Pain, Strength, IADL, UE functional use       Visit Diagnosis: Stiffness of right shoulder, not elsewhere classified  Decreased ROM of right shoulder  Acute pain of right shoulder  Other symptoms and signs involving the musculoskeletal system    Problem List Patient Active Problem List   Diagnosis Date Noted   Allergy to alpha-gal    Hypothyroidism 08/28/2013   Hyperlipidemia    Obesity    Danie Chandler OT, MOT  Danie Chandler, OT 09/05/2021, 11:59 AM  [facility] [address] [place], [place], [address] Phone: [phone]   Fax:  [phone]  Name: Darl Kuss MRN: [id number] Date of Birth: April 12, 1961

## 2021-09-05 NOTE — Addendum Note (Signed)
Addended by: Danie Chandler on: 09/05/2021 12:18 PM   Modules accepted: Orders

## 2021-09-06 DIAGNOSIS — G4733 Obstructive sleep apnea (adult) (pediatric): Secondary | ICD-10-CM | POA: Diagnosis not present

## 2021-09-07 ENCOUNTER — Ambulatory Visit (HOSPITAL_COMMUNITY): Payer: BC Managed Care – PPO | Admitting: Occupational Therapy

## 2021-09-07 ENCOUNTER — Other Ambulatory Visit: Payer: Self-pay

## 2021-09-07 ENCOUNTER — Encounter: Payer: Self-pay | Admitting: Family Medicine

## 2021-09-07 ENCOUNTER — Encounter (HOSPITAL_COMMUNITY): Payer: Self-pay | Admitting: Occupational Therapy

## 2021-09-07 DIAGNOSIS — M25511 Pain in right shoulder: Secondary | ICD-10-CM

## 2021-09-07 DIAGNOSIS — R29898 Other symptoms and signs involving the musculoskeletal system: Secondary | ICD-10-CM | POA: Diagnosis not present

## 2021-09-07 DIAGNOSIS — M25611 Stiffness of right shoulder, not elsewhere classified: Secondary | ICD-10-CM | POA: Diagnosis not present

## 2021-09-07 MED ORDER — SOLIFENACIN SUCCINATE 10 MG PO TABS: 10.0000 mg | ORAL_TABLET | Freq: Every day | ORAL | 1 refills | Status: DC

## 2021-09-07 NOTE — Therapy (Signed)
[facility] [address] [place], [place], [address] Phone: [phone]   Fax:  [phone]  Occupational Therapy Treatment  Patient Details  Name: Juan Stone MRN: [id number] Date of Birth: 11/24/1960 Referring Provider (OT): Elana Alm. Thurston Hole, M.D.   Encounter Date: 09/07/2021   OT End of Session - 09/07/21 1606     Visit Number 2    Number of Visits 24    Date for OT Re-Evaluation 10/17/21   midway mini-reassess   Authorization Type BSBS COMM PPO    Authorization Time Period no auth; 60 visit limit    Authorization - Visit Number 2    Authorization - Number of Visits 24    OT Start Time 1030    OT Stop Time 1113    OT Time Calculation (min) 43 min    Activity Tolerance Patient tolerated treatment well    Behavior During Therapy WFL for tasks assessed/performed             Past Medical History:  Diagnosis Date   Allergy to alpha-gal    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 Coralyn Pear   CORNEAL TRANSPLANT     Fuch's dystorphy (bilateral)   HERNIA REPAIR N/A    Phreesia 05/25/2020    There were no vitals filed for this visit.   Subjective Assessment - 09/07/21 1030     Currently in Pain? Yes    Pain Score 4     Pain Location Shoulder    Pain Orientation Right    Pain Descriptors / Indicators Aching;Sharp    Pain Type Acute pain;Surgical pain    Pain Radiating Towards radiating down back of arm    Pain Onset In the past 7 days    Pain Frequency Constant    Aggravating Factors  Position and movement    Pain Relieving Factors medication    Effect of Pain on Daily Activities maximal                [facility] OT Assessment - 09/07/21 0001       Assessment   Medical Diagnosis s/p R shoulder bicepts tenodieis,  rotator cuff, partial labrum and partial bicepts tendon tear debridement, subacromial decompression, R shoulder DCE. Opertaion on 08/25/21      Precautions   Precautions Shoulder    Type of Shoulder Precautions Passive range of motion only of shoulder and his elbow for 5 weeks post op. Sling for six weeks post op. Pt reported operation took place on 08/25/21.    Shoulder Interventions Shoulder sling/immobilizer                      OT Treatments/Exercises (OP) - 09/07/21 0001       Exercises   Exercises Shoulder      Shoulder Exercises: Supine   Protraction PROM;10 reps;Right    Horizontal ABduction PROM;Right;10 reps    External Rotation PROM;10 reps;Right    Internal Rotation PROM;10 reps;Right    Flexion PROM;10 reps;Right    ABduction PROM;10 reps;Right      Shoulder Exercises: Therapy Ball   Flexion 10 reps;Right   3 second hold   ABduction 10 reps;Right   3 second hold     Shoulder  Exercises: Stretch   Other Shoulder Stretches Pendulum 3 positions 1 mintue each. (side to side, circules, forward and backward.)      Manual Therapy   Manual Therapy Myofascial release    Manual therapy comments Myofascial release manual therapy completed seperately from all other interventions.    Myofascial Release myofascial relsaes and manual stretching to R upper arm, scapular, and shoulder region to decrease pain and restrictions and improve pain free mobility.                      OT Short Term Goals - 09/07/21 1610       OT SHORT TERM GOAL #1   Title Pt will be provided with and educated on HEP to improve mobility in RUE required for ADL completion.    Time 6    Period Weeks    Status On-going    Target Date 10/17/21      OT SHORT TERM GOAL #2   Title Pt will decrease RUE fascial restrictions to minimal amounts or less to improve mobility required for overhead reaching tasks.    Time 6    Period Weeks    Status On-going    Target Date 10/17/21       OT SHORT TERM GOAL #3   Title Pt will decrease pain in RUE to 3/10 or less in order to sleep for 4 to 5+ consecutive hours without waking due to pain 75% of data opporutnieis.    Time 6    Period Weeks    Status On-going    Target Date 10/17/21      OT SHORT TERM GOAL #4   Title Patient will improve R UE P/ROM to [facility] in order to improve mobility for functional reaching tasks that are above shoulder height.    Time 6    Period Weeks    Status On-going    Target Date 03/06/22               OT Long Term Goals - 09/07/21 1610       OT LONG TERM GOAL #1   Title Patient will report using his R UE for ADL tasks including active ROM with 2/10 pain or less 75% of data opportunities.    Time 12    Period Weeks    Status On-going    Target Date 11/28/21      OT LONG TERM GOAL #2   Title Pt will increase A/ROM of RUE to [facility] to improve ability to reach overhead and behind back during dressing and bathing tasks.    Time 12    Period Weeks    Status On-going    Target Date 11/28/21      OT LONG TERM GOAL #3   Title Pt will increase strength in RUE to 4+/5 to improve ability to perform lifting tasks required for work and daily activities.    Time 12    Period Weeks    Status On-going    Target Date 11/28/20                   Plan - 09/07/21 1606     Clinical Impression Statement A: Pt toerlated myofascial release well with restrictions noted in inferior scapula area as well and anterior deltoid. Pt tolerated P/ROM without significant reports of increased pain. Pt completed reps of theraball P/ROM with 3 second hold and pendulums. Noted difficulty with relaxing R arm during side to side pedulum exercise.  Verbal cuing needed at times.    Body Structure / Function / Physical Skills ADL;Endurance;Fascial restriction;ROM;Pain;Strength;IADL;UE functional use    Plan Continue myofascial release and P/ROM exercises following protocol.    OT Home Exercise Plan 09/05/21: table  slides flexion and abduction    Consulted and Agree with Plan of Care Patient             Patient will benefit from skilled therapeutic intervention in order to improve the following deficits and impairments:   Body Structure / Function / Physical Skills: ADL, Endurance, Fascial restriction, ROM, Pain, Strength, IADL, UE functional use       Visit Diagnosis: Stiffness of right shoulder, not elsewhere classified  Decreased ROM of right shoulder  Acute pain of right shoulder  Other symptoms and signs involving the musculoskeletal system    Problem List Patient Active Problem List   Diagnosis Date Noted   Allergy to alpha-gal    Hypothyroidism 08/28/2013   Hyperlipidemia    Obesity    Danie Chandler OT, MOT  Danie Chandler, OT 09/07/2021, 4:10 PM  [place] [facility] [address] [place], [place], [address] Phone: [phone]   Fax:  [phone]  Name: Juan Stone MRN: [id number] Date of Birth: 1960-12-24

## 2021-09-08 ENCOUNTER — Encounter (HOSPITAL_COMMUNITY): Payer: BC Managed Care – PPO

## 2021-09-09 ENCOUNTER — Encounter (HOSPITAL_COMMUNITY): Payer: BC Managed Care – PPO | Admitting: Occupational Therapy

## 2021-09-14 ENCOUNTER — Ambulatory Visit (HOSPITAL_COMMUNITY): Payer: BC Managed Care – PPO | Admitting: Occupational Therapy

## 2021-09-15 ENCOUNTER — Ambulatory Visit (HOSPITAL_COMMUNITY): Payer: BC Managed Care – PPO | Admitting: Occupational Therapy

## 2021-09-15 ENCOUNTER — Encounter (HOSPITAL_COMMUNITY): Payer: Self-pay | Admitting: Occupational Therapy

## 2021-09-15 ENCOUNTER — Other Ambulatory Visit: Payer: Self-pay

## 2021-09-15 DIAGNOSIS — R29898 Other symptoms and signs involving the musculoskeletal system: Secondary | ICD-10-CM | POA: Diagnosis not present

## 2021-09-15 DIAGNOSIS — M25511 Pain in right shoulder: Secondary | ICD-10-CM

## 2021-09-15 DIAGNOSIS — M25611 Stiffness of right shoulder, not elsewhere classified: Secondary | ICD-10-CM

## 2021-09-15 NOTE — Therapy (Signed)
[facility] [address] [place], [place], [address] Phone: [phone]   Fax:  [phone]  Occupational Therapy Treatment  Patient Details  Name: Juan Stone MRN: [id number] Date of Birth: [birth year] Referring Provider (OT): Elana Alm. Thurston Hole, M.D.   Encounter Date: 09/15/2021   OT End of Session - 09/15/21 0739     Visit Number 3    Number of Visits 24    Date for OT Re-Evaluation 10/17/21   midway mini-reassess   Authorization Type BSBS COMM PPO    Authorization Time Period no auth; 60 visit limit    Authorization - Visit Number 3    Authorization - Number of Visits 24    OT Start Time 0734    OT Stop Time [phone]    OT Time Calculation (min) 38 min    Activity Tolerance Patient tolerated treatment well    Behavior During Therapy WFL for tasks assessed/performed             Past Medical History:  Diagnosis Date   Allergy to alpha-gal    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 Coralyn Pear   CORNEAL TRANSPLANT     Fuch's dystorphy (bilateral)   HERNIA REPAIR N/A    Phreesia 05/25/2020    There were no vitals filed for this visit.       [facility] OT Assessment - 09/15/21 0738       Assessment   Medical Diagnosis s/p R shoulder bicepts tenodieis, rotator cuff, partial labrum and partial bicepts tendon tear debridement, subacromial decompression, R shoulder DCE. Opertaion on 08/25/21      Precautions   Precautions Shoulder    Type of Shoulder Precautions Passive range of motion only of shoulder and his elbow for 5 weeks post op. Sling for six weeks post op. Pt reported operation took place on 08/25/21.    Shoulder Interventions Shoulder sling/immobilizer                      OT Treatments/Exercises (OP) -  09/15/21 0739       Exercises   Exercises Shoulder      Shoulder Exercises: Supine   Protraction PROM;10 reps;Right    Horizontal ABduction PROM;Right;10 reps    External Rotation PROM;10 reps;Right    Internal Rotation PROM;10 reps;Right    Flexion PROM;10 reps;Right    ABduction PROM;10 reps;Right      Shoulder Exercises: Therapy Ball   Flexion 10 reps;Right   3" hold   ABduction 10 reps;Right   3" hold     Shoulder Exercises: ROM/Strengthening   Pendulum 3 positions, 1' each      Manual Therapy   Manual Therapy Myofascial release    Manual therapy comments Myofascial release manual therapy completed seperately from all other interventions.    Myofascial Release myofascial relsaes and manual stretching to R upper arm, scapular, and shoulder region to decrease pain and restrictions and improve pain free mobility.                      OT Short Term Goals - 09/07/21 1610       OT SHORT TERM GOAL #1   Title Pt will be provided with and  educated on HEP to improve mobility in RUE required for ADL completion.    Time 6    Period Weeks    Status On-going    Target Date 10/17/21      OT SHORT TERM GOAL #2   Title Pt will decrease RUE fascial restrictions to minimal amounts or less to improve mobility required for overhead reaching tasks.    Time 6    Period Weeks    Status On-going    Target Date 10/17/21      OT SHORT TERM GOAL #3   Title Pt will decrease pain in RUE to 3/10 or less in order to sleep for 4 to 5+ consecutive hours without waking due to pain 75% of data opporutnieis.    Time 6    Period Weeks    Status On-going    Target Date 10/17/21      OT SHORT TERM GOAL #4   Title Patient will improve R UE P/ROM to [facility] in order to improve mobility for functional reaching tasks that are above shoulder height.    Time 6    Period Weeks    Status On-going    Target Date 03/06/22               OT Long Term Goals - 09/07/21 1610       OT LONG  TERM GOAL #1   Title Patient will report using his R UE for ADL tasks including active ROM with 2/10 pain or less 75% of data opportunities.    Time 12    Period Weeks    Status On-going    Target Date 11/28/21      OT LONG TERM GOAL #2   Title Pt will increase A/ROM of RUE to [facility] to improve ability to reach overhead and behind back during dressing and bathing tasks.    Time 12    Period Weeks    Status On-going    Target Date 11/28/21      OT LONG TERM GOAL #3   Title Pt will increase strength in RUE to 4+/5 to improve ability to perform lifting tasks required for work and daily activities.    Time 12    Period Weeks    Status On-going    Target Date 11/28/20                   Plan - 09/15/21 0800     Clinical Impression Statement A: Pt reports he saw the MD yesterday and everything looks good. Continued with myofascial release to address fascial restrictions, more restrictions in upper arm today than deltoid regions. Continued with P/ROM, pendulums, and therapy ball stretches. Verbal cuing for form and technique.    Body Structure / Function / Physical Skills ADL;Endurance;Fascial restriction;ROM;Pain;Strength;IADL;UE functional use    Plan Continue myofascial release and P/ROM exercises following protocol.    OT Home Exercise Plan 09/05/21: table slides flexion and abduction    Consulted and Agree with Plan of Care Patient             Patient will benefit from skilled therapeutic intervention in order to improve the following deficits and impairments:   Body Structure / Function / Physical Skills: ADL, Endurance, Fascial restriction, ROM, Pain, Strength, IADL, UE functional use       Visit Diagnosis: Stiffness of right shoulder, not elsewhere classified  Acute pain of right shoulder  Other symptoms and signs involving the musculoskeletal system    Problem List  Patient Active Problem List   Diagnosis Date Noted   Allergy to alpha-gal     Hypothyroidism 08/28/2013   Hyperlipidemia    Obesity     Ezra Sites, OTR/L  [phone] 09/15/2021, 8:13 AM  [place] [facility] [address] [place], [place], [address] Phone: [phone]   Fax:  [phone]  Name: Hikaru Delorenzo MRN: [id number] Date of Birth: Dec 20, 1960

## 2021-09-21 ENCOUNTER — Encounter (HOSPITAL_COMMUNITY): Payer: Self-pay | Admitting: Occupational Therapy

## 2021-09-21 ENCOUNTER — Ambulatory Visit (HOSPITAL_COMMUNITY): Payer: BC Managed Care – PPO | Attending: Orthopedic Surgery | Admitting: Occupational Therapy

## 2021-09-21 ENCOUNTER — Other Ambulatory Visit: Payer: Self-pay

## 2021-09-21 DIAGNOSIS — R29898 Other symptoms and signs involving the musculoskeletal system: Secondary | ICD-10-CM | POA: Diagnosis not present

## 2021-09-21 DIAGNOSIS — M25611 Stiffness of right shoulder, not elsewhere classified: Secondary | ICD-10-CM | POA: Insufficient documentation

## 2021-09-21 DIAGNOSIS — M25511 Pain in right shoulder: Secondary | ICD-10-CM | POA: Diagnosis not present

## 2021-09-21 DIAGNOSIS — G4733 Obstructive sleep apnea (adult) (pediatric): Secondary | ICD-10-CM | POA: Diagnosis not present

## 2021-09-21 NOTE — Therapy (Addendum)
[facility] [address] [place], [place], [address] Phone: [phone]   Fax:  [phone]  Occupational Therapy Treatment  Patient Details  Name: Juan Stone MRN: [id number] Date of Birth: 1961-07-20 Referring Provider (OT): Elana Alm. Thurston Hole, M.D.   Encounter Date: 09/21/2021   OT End of Session - 09/21/21 0755     Visit Number 4    Number of Visits 24    Date for OT Re-Evaluation 10/17/21   midway mini-reassess   Authorization Type BSBS COMM PPO    Authorization Time Period no auth; 60 visit limit    Authorization - Visit Number 1   Authorization - Number of Visits 60    OT Start Time [phone]    OT Stop Time 0805   session ending slightly early due to P/ROM only protocol   OT Time Calculation (min) 32 min    Activity Tolerance Patient tolerated treatment well    Behavior During Therapy WFL for tasks assessed/performed             Past Medical History:  Diagnosis Date   Allergy to alpha-gal    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 Coralyn Pear   CORNEAL TRANSPLANT     Fuch's dystorphy (bilateral)   HERNIA REPAIR N/A    Phreesia 05/25/2020    There were no vitals filed for this visit.   Subjective Assessment - 09/21/21 0735     Subjective  S: It seems to be feeling better.    Currently in Pain? No/denies                [facility] OT Assessment - 09/21/21 0735       Assessment   Medical Diagnosis s/p R shoulder bicepts tenodieis, rotator cuff, partial labrum and partial bicepts tendon tear debridement, subacromial decompression, R shoulder DCE. Opertaion on 08/25/21      Precautions   Precautions Shoulder    Type of Shoulder Precautions Passive range of motion only of shoulder and his elbow for 5 weeks post op. Sling  for six weeks post op. Pt reported operation took place on 08/25/21.    Shoulder Interventions Shoulder sling/immobilizer                      OT Treatments/Exercises (OP) - 09/21/21 0752       Exercises   Exercises Shoulder      Shoulder Exercises: Supine   Protraction PROM;10 reps;Right    Horizontal ABduction PROM;Right;10 reps    External Rotation PROM;10 reps;Right    Internal Rotation PROM;10 reps;Right    Flexion PROM;10 reps;Right    ABduction PROM;10 reps;Right      Shoulder Exercises: Therapy Ball   Flexion 10 reps;Right   3" hold at end range   ABduction 10 reps;Right   3" hold at end range     Shoulder Exercises: ROM/Strengthening   Pendulum 3 positions, 1' each      Manual Therapy   Manual Therapy Myofascial release    Manual therapy comments Myofascial release manual therapy completed seperately from all other interventions.    Myofascial Release myofascial relsaes and manual stretching to R upper arm, scapular, and shoulder region to decrease pain and restrictions and improve  pain free mobility.                      OT Short Term Goals - 09/07/21 1610       OT SHORT TERM GOAL #1   Title Pt will be provided with and educated on HEP to improve mobility in RUE required for ADL completion.    Time 6    Period Weeks    Status On-going    Target Date 10/17/21      OT SHORT TERM GOAL #2   Title Pt will decrease RUE fascial restrictions to minimal amounts or less to improve mobility required for overhead reaching tasks.    Time 6    Period Weeks    Status On-going    Target Date 10/17/21      OT SHORT TERM GOAL #3   Title Pt will decrease pain in RUE to 3/10 or less in order to sleep for 4 to 5+ consecutive hours without waking due to pain 75% of data opporutnieis.    Time 6    Period Weeks    Status On-going    Target Date 10/17/21      OT SHORT TERM GOAL #4   Title Patient will improve R UE P/ROM to [facility] in order to improve  mobility for functional reaching tasks that are above shoulder height.    Time 6    Period Weeks    Status On-going    Target Date 03/06/22               OT Long Term Goals - 09/07/21 1610       OT LONG TERM GOAL #1   Title Patient will report using his R UE for ADL tasks including active ROM with 2/10 pain or less 75% of data opportunities.    Time 12    Period Weeks    Status On-going    Target Date 11/28/21      OT LONG TERM GOAL #2   Title Pt will increase A/ROM of RUE to [facility] to improve ability to reach overhead and behind back during dressing and bathing tasks.    Time 12    Period Weeks    Status On-going    Target Date 11/28/21      OT LONG TERM GOAL #3   Title Pt will increase strength in RUE to 4+/5 to improve ability to perform lifting tasks required for work and daily activities.    Time 12    Period Weeks    Status On-going    Target Date 11/28/20                   Plan - 09/21/21 0754     Clinical Impression Statement A: Pt reports he is having less pain now. Continued with myofascial release to address fascial restrictions, also palpated moderate muscle knot in upper trapezius and addressed. Continued with P/ROM, pendulums, and therapy ball stretches. Verbal cuing for form and technique.    Body Structure / Function / Physical Skills ADL;Endurance;Fascial restriction;ROM;Pain;Strength;IADL;UE functional use    Plan P: Continue myofascial release and P/ROM exercises following protocol.    OT Home Exercise Plan 09/05/21: table slides flexion and abduction    Consulted and Agree with Plan of Care Patient             Patient will benefit from skilled therapeutic intervention in order to improve the following deficits and impairments:   Body Structure /  Function / Physical Skills: ADL, Endurance, Fascial restriction, ROM, Pain, Strength, IADL, UE functional use       Visit Diagnosis: Stiffness of right shoulder, not elsewhere  classified  Acute pain of right shoulder  Other symptoms and signs involving the musculoskeletal system    Problem List Patient Active Problem List   Diagnosis Date Noted   Allergy to alpha-gal    Hypothyroidism 08/28/2013   Hyperlipidemia    Obesity     Ezra SitesLeslie Heran Campau, OTR/L  [phone] 09/21/2021, 8:07 AM  [facility] [address] [place], [place], [address] Phone: [phone]   Fax:  [phone]  Name: Sofie HartiganRobert Iodice MRN: [id number] Date of Birth: [birth year]

## 2021-09-23 ENCOUNTER — Ambulatory Visit (HOSPITAL_COMMUNITY): Payer: BC Managed Care – PPO | Admitting: Occupational Therapy

## 2021-09-23 ENCOUNTER — Encounter (HOSPITAL_COMMUNITY): Payer: Self-pay | Admitting: Occupational Therapy

## 2021-09-23 ENCOUNTER — Other Ambulatory Visit: Payer: Self-pay

## 2021-09-23 DIAGNOSIS — R29898 Other symptoms and signs involving the musculoskeletal system: Secondary | ICD-10-CM

## 2021-09-23 DIAGNOSIS — M25611 Stiffness of right shoulder, not elsewhere classified: Secondary | ICD-10-CM | POA: Diagnosis not present

## 2021-09-23 DIAGNOSIS — M25511 Pain in right shoulder: Secondary | ICD-10-CM | POA: Diagnosis not present

## 2021-09-23 NOTE — Therapy (Addendum)
[facility] [address] [place], [place], [address] Phone: [phone]   Fax:  [phone]  Occupational Therapy Treatment  Patient Details  Name: Juan HartiganRobert Stone MRN: [id number] Date of Birth: November 02, 1960 Referring Provider (OT): Elana Almobert A. Thurston HoleWainer, M.D.   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   CORNEAL TRANSPLANT     Fuch's dystorphy (bilateral)   HERNIA REPAIR N/A    Phreesia 05/25/2020    There were no vitals filed for this visit.   Subjective Assessment - 09/23/21 1116     Subjective  S: Little sore today.    Currently in Pain? Yes    Pain Score 4     Pain Location Shoulder    Pain Orientation Right    Pain Descriptors / Indicators Aching;Sore    Pain Type Acute pain;Surgical pain    Pain Onset In the past 7 days    Pain Frequency Constant    Aggravating Factors  Position and movement    Pain Relieving Factors medication    Effect of Pain on Daily Activities maximal                [facility] OT Assessment - 09/23/21 0001       Assessment   Medical Diagnosis s/p R shoulder bicepts tenodieis, rotator cuff, partial  labrum and partial bicepts tendon tear debridement, subacromial decompression, R shoulder DCE. Opertaion on 08/25/21      Precautions   Precautions Shoulder    Type of Shoulder Precautions Passive range of motion only of shoulder and his elbow for 5 weeks post op. Sling for six weeks post op. Pt reported operation took place on 08/25/21.    Shoulder Interventions Shoulder sling/immobilizer                      OT Treatments/Exercises (OP) - 09/23/21 0001       Exercises   Exercises Shoulder      Shoulder Exercises: Supine   Protraction PROM;10 reps;Right    Horizontal ABduction PROM;Right;10 reps    External Rotation PROM;10 reps;Right    Internal Rotation PROM;10 reps;Right    Flexion PROM;10 reps;Right    ABduction PROM;10 reps;Right      Shoulder Exercises: Pulleys   Flexion 1 minute    ABduction 1 minute      Shoulder Exercises: Therapy Ball   Flexion 10 reps;Right   3 sec hold  ABduction 10 reps;Right   3 sec hold     Shoulder Exercises: ROM/Strengthening   Pendulum --      Manual Therapy   Manual Therapy Myofascial release    Manual therapy comments Myofascial release manual therapy completed seperately from all other interventions.    Myofascial Release myofascial relsaes and manual stretching to R upper arm, scapular, and shoulder region to decrease pain and restrictions and improve pain free mobility.                      OT Short Term Goals - 09/07/21 1610       OT SHORT TERM GOAL #1   Title Pt will be provided with and educated on HEP to improve mobility in RUE required for ADL completion.    Time 6    Period Weeks    Status On-going    Target Date 10/17/21      OT SHORT TERM GOAL #2   Title Pt will decrease RUE fascial restrictions to minimal amounts or less to improve mobility required for overhead reaching tasks.    Time 6    Period Weeks    Status On-going    Target Date 10/17/21      OT SHORT TERM GOAL #3   Title Pt  will decrease pain in RUE to 3/10 or less in order to sleep for 4 to 5+ consecutive hours without waking due to pain 75% of data opporutnieis.    Time 6    Period Weeks    Status On-going    Target Date 10/17/21      OT SHORT TERM GOAL #4   Title Patient will improve R UE P/ROM to [facility] in order to improve mobility for functional reaching tasks that are above shoulder height.    Time 6    Period Weeks    Status On-going    Target Date 03/06/22               OT Long Term Goals - 09/07/21 1610       OT LONG TERM GOAL #1   Title Patient will report using his R UE for ADL tasks including active ROM with 2/10 pain or less 75% of data opportunities.    Time 12    Period Weeks    Status On-going    Target Date 11/28/21      OT LONG TERM GOAL #2   Title Pt will increase A/ROM of RUE to [facility] to improve ability to reach overhead and behind back during dressing and bathing tasks.    Time 12    Period Weeks    Status On-going    Target Date 11/28/21      OT LONG TERM GOAL #3   Title Pt will increase strength in RUE to 4+/5 to improve ability to perform lifting tasks required for work and daily activities.    Time 12    Period Weeks    Status On-going    Target Date 11/28/20                   Plan - 09/23/21 1222     Clinical Impression Statement A: Pt reports feeling sore in shoulder today. Continued myofascial release with pt reporting soreness specifically in R biceps area. Continued P/ROM and theraball stretches. Introduced pulley's exercise. Verbal and tactile cuing for form and technique.    Body Structure / Function / Physical Skills ADL;Endurance;Fascial restriction;ROM;Pain;Strength;IADL;UE functional use  Plan P: Continue myofascial release and P/ROM exercises following protocol.    OT Home Exercise Plan 09/05/21: table slides flexion and abduction    Consulted and Agree with Plan of Care Patient             Patient will benefit from skilled  therapeutic intervention in order to improve the following deficits and impairments:   Body Structure / Function / Physical Skills: ADL, Endurance, Fascial restriction, ROM, Pain, Strength, IADL, UE functional use       Visit Diagnosis: Stiffness of right shoulder, not elsewhere classified  Acute pain of right shoulder  Other symptoms and signs involving the musculoskeletal system  Decreased ROM of right shoulder    Problem List Patient Active Problem List   Diagnosis Date Noted   Allergy to alpha-gal    Hypothyroidism 08/28/2013   Hyperlipidemia    Obesity    Danie Chandler OT, MOT   Danie Chandler, OT 09/23/2021, 12:24 PM  [facility] [address] [place], [place], [address] Phone: [phone]   Fax:  [phone]  Name: Juan Stone MRN: [id number] Date of Birth: Dec 02, 1960

## 2021-09-26 ENCOUNTER — Other Ambulatory Visit: Payer: Self-pay

## 2021-09-26 ENCOUNTER — Ambulatory Visit (HOSPITAL_COMMUNITY): Payer: BC Managed Care – PPO | Admitting: Occupational Therapy

## 2021-09-26 ENCOUNTER — Encounter (HOSPITAL_COMMUNITY): Payer: Self-pay | Admitting: Occupational Therapy

## 2021-09-26 DIAGNOSIS — R29898 Other symptoms and signs involving the musculoskeletal system: Secondary | ICD-10-CM | POA: Diagnosis not present

## 2021-09-26 DIAGNOSIS — M25511 Pain in right shoulder: Secondary | ICD-10-CM

## 2021-09-26 DIAGNOSIS — M25611 Stiffness of right shoulder, not elsewhere classified: Secondary | ICD-10-CM | POA: Diagnosis not present

## 2021-09-26 NOTE — Therapy (Addendum)
[facility] [address] [place], [place], [address] Phone: [phone]   Fax:  [phone]  Occupational Therapy Treatment  Patient Details  Name: Juan Stone MRN: [id number] Date of Birth: 01/10/1961 Referring Provider (OT): Elana Alm. Thurston Hole, M.D.   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 Coralyn Pear   CORNEAL TRANSPLANT     Fuch's dystorphy (bilateral)   HERNIA REPAIR N/A    Phreesia 05/25/2020    There were no vitals filed for this visit.   Subjective Assessment - 09/26/21 1518     Subjective  S: It is good but when I lift my arm I feel pain.    Currently in Pain? Yes    Pain Score 5     Pain Location Shoulder    Pain Orientation Right    Pain Descriptors / Indicators Aching;Sore;Sharp    Pain Type Acute pain;Surgical pain    Pain Radiating Towards no    Pain Onset In the past 7 days    Pain Frequency Constant    Aggravating Factors  Position and movement    Pain Relieving Factors medication    Effect of Pain on Daily Activities maximal                [facility] OT Assessment - 09/26/21 0001       Assessment   Medical  Diagnosis s/p R shoulder bicepts tenodieis, rotator cuff, partial labrum and partial bicepts tendon tear debridement, subacromial decompression, R shoulder DCE. Opertaion on 08/25/21      Precautions   Precautions Shoulder    Type of Shoulder Precautions Passive range of motion only of shoulder and his elbow for 5 weeks post op. Sling for six weeks post op. Pt reported operation took place on 08/25/21.    Shoulder Interventions Shoulder sling/immobilizer                      OT Treatments/Exercises (OP) - 09/26/21 0001       Exercises   Exercises Shoulder      Shoulder Exercises: Supine   Protraction PROM;10 reps;Right    Horizontal ABduction PROM;Right;10 reps    External Rotation PROM;10 reps;Right    Internal Rotation PROM;10 reps;Right    Flexion PROM;10 reps;Right    ABduction PROM;10 reps;Right      Shoulder Exercises: Pulleys   Flexion 1 minute    ABduction 1 minute  Shoulder Exercises: Therapy Ball   Flexion 10 reps;Right    ABduction 10 reps;Right      Shoulder Exercises: ROM/Strengthening   Pendulum 3 positions, 1' each      Manual Therapy   Manual Therapy Myofascial release    Manual therapy comments Myofascial release manual therapy completed seperately from all other interventions.    Myofascial Release myofascial relsaes and manual stretching to R upper arm, scapular, and shoulder region to decrease pain and restrictions and improve pain free mobility.                      OT Short Term Goals - 09/07/21 1610       OT SHORT TERM GOAL #1   Title Pt will be provided with and educated on HEP to improve mobility in RUE required for ADL completion.    Time 6    Period Weeks    Status On-going    Target Date 10/17/21      OT SHORT TERM GOAL #2   Title Pt will decrease RUE fascial restrictions to minimal amounts or less to improve mobility required for overhead reaching tasks.    Time 6    Period Weeks    Status On-going     Target Date 10/17/21      OT SHORT TERM GOAL #3   Title Pt will decrease pain in RUE to 3/10 or less in order to sleep for 4 to 5+ consecutive hours without waking due to pain 75% of data opporutnieis.    Time 6    Period Weeks    Status On-going    Target Date 10/17/21      OT SHORT TERM GOAL #4   Title Patient will improve R UE P/ROM to [facility] in order to improve mobility for functional reaching tasks that are above shoulder height.    Time 6    Period Weeks    Status On-going    Target Date 03/06/22               OT Long Term Goals - 09/07/21 1610       OT LONG TERM GOAL #1   Title Patient will report using his R UE for ADL tasks including active ROM with 2/10 pain or less 75% of data opportunities.    Time 12    Period Weeks    Status On-going    Target Date 11/28/21      OT LONG TERM GOAL #2   Title Pt will increase [facility] to [facility] to improve ability to reach overhead and behind back during dressing and bathing tasks.    Time 12    Period Weeks    Status On-going    Target Date 11/28/21      OT LONG TERM GOAL #3   Title Pt will increase strength in RUE to 4+/5 to improve ability to perform lifting tasks required for work and daily activities.    Time 12    Period Weeks    Status On-going    Target Date 11/28/20                   Plan - 09/26/21 1559     Clinical Impression Statement A: Pt contines to show improved P/ROM. Pt tolerated myofascial release followed by P/ROM in supine. Pendulums followed by therabal stretches were completed. Pt finished with pulleys. Verbal and tactile cues for form and technique as needed.    Body  Structure / Function / Physical Skills ADL;Endurance;Fascial restriction;ROM;Pain;Strength;IADL;UE functional use    OT Treatment/Interventions Self-care/ADL training;Electrical Stimulation;Cryotherapy;Moist Heat;Ultrasound;Therapeutic exercise;Manual Therapy;Therapeutic activities;Patient/family education    Plan P: Continue  myofascial release and P/ROM exercises following protocol. Take updated ROM either next session or the one after due to pt seeiong his doctor January 16th at 4 PM for follow up.    OT Home Exercise Plan 09/05/21: table slides flexion and abduction    Consulted and Agree with Plan of Care Patient             Patient will benefit from skilled therapeutic intervention in order to improve the following deficits and impairments:   Body Structure / Function / Physical Skills: ADL, Endurance, Fascial restriction, ROM, Pain, Strength, IADL, UE functional use       Visit Diagnosis: Stiffness of right shoulder, not elsewhere classified  Acute pain of right shoulder  Other symptoms and signs involving the musculoskeletal system  Decreased ROM of right shoulder    Problem List Patient Active Problem List   Diagnosis Date Noted   Allergy to alpha-gal    Hypothyroidism 08/28/2013   Hyperlipidemia    Obesity    Danie Chandler OT, MOT   Danie Chandler, OT 09/26/2021, 4:10 PM  [facility] [address] [place], [place], [address] Phone: [phone]   Fax:  [phone]  Name: Juan Stone MRN: [id number] Date of Birth: [birth year]

## 2021-09-28 ENCOUNTER — Ambulatory Visit (HOSPITAL_COMMUNITY): Payer: BC Managed Care – PPO

## 2021-09-28 ENCOUNTER — Other Ambulatory Visit: Payer: Self-pay

## 2021-09-28 ENCOUNTER — Encounter (HOSPITAL_COMMUNITY): Payer: Self-pay

## 2021-09-28 DIAGNOSIS — R29898 Other symptoms and signs involving the musculoskeletal system: Secondary | ICD-10-CM

## 2021-09-28 DIAGNOSIS — M25511 Pain in right shoulder: Secondary | ICD-10-CM

## 2021-09-28 DIAGNOSIS — M25611 Stiffness of right shoulder, not elsewhere classified: Secondary | ICD-10-CM

## 2021-09-28 NOTE — Patient Instructions (Signed)

## 2021-09-28 NOTE — Therapy (Signed)
[facility] [address] [place], [place], [address] Phone: [phone]   Fax:  [phone]  Occupational Therapy Treatment  Patient Details  Name: Juan Stone MRN: [id number] Date of Birth: [birth year] Referring Provider (OT): Elana Alm. Thurston Hole, M.D.   Encounter Date: 09/28/2021   OT End of Session - 09/28/21 0947     Visit Number 7    Number of Visits 24    Date for OT Re-Evaluation 11/28/21   mini reassess: 10/26/21   Authorization Type BSBS COMM PPO    Authorization Time Period no auth; 60 visit limit per calendar year    Authorization - Visit Number 4    Authorization - Number of Visits 60    OT Start Time 0900    OT Stop Time [phone]    OT Time Calculation (min) 38 min    Activity Tolerance Patient tolerated treatment well    Behavior During Therapy WFL for tasks assessed/performed             Past Medical History:  Diagnosis Date   Allergy to alpha-gal    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 Coralyn Pear   CORNEAL TRANSPLANT     Fuch's dystorphy (bilateral)   HERNIA REPAIR N/A    Phreesia 05/25/2020    There were no vitals filed for this visit.   Subjective Assessment - 09/28/21 0907     Subjective  S: It's feeling pretty good today.    Currently in Pain? No/denies                [facility] OT Assessment - 09/28/21 0908       Assessment   Medical Diagnosis s/p R shoulder biceps tenodisis, rotator cuff, partial labrum and partial bicepts tendon tear debridement, subacromial decompression, R shoulder DCE.      Precautions   Precautions Shoulder    Type of Shoulder Precautions Passive range of motion only of shoulder and his elbow for 5 weeks post op. Sling for six weeks post op. Pt reported operation took place on  08/25/21.    Shoulder Interventions Shoulder sling/immobilizer      ROM / Strength   AROM / PROM / Strength AROM;PROM;Strength      PROM   Overall PROM Comments Assessed supine. IT/er adducted    PROM Assessment Site Shoulder    Right/Left Shoulder Right    Right Shoulder Flexion 140 Degrees   previous: 86   Right Shoulder ABduction 180 Degrees   previous: 66   Right Shoulder Internal Rotation 90 Degrees   previous: same   Right Shoulder External Rotation 55 Degrees   previous: 30     Strength   Overall Strength Unable to assess;Due to precautions                      OT Treatments/Exercises (OP) - 09/28/21 0927       Exercises   Exercises Shoulder      Shoulder Exercises: Supine   Protraction PROM;5 reps;AAROM;10 reps    Horizontal ABduction PROM;5 reps;AAROM;10 reps    External Rotation PROM;5 reps;AAROM;10 reps    Internal Rotation PROM;5 reps;AAROM;10 reps    Flexion PROM;5 reps;AAROM;10 reps    ABduction PROM;5 reps;AAROM;10  reps      Shoulder Exercises: Pulleys   Flexion 1 minute   seated   ABduction 1 minute   seated     Shoulder Exercises: ROM/Strengthening   Wall Wash 1'      Manual Therapy   Manual Therapy Myofascial release    Manual therapy comments Myofascial release manual therapy completed seperately from all other interventions.    Myofascial Release myofascial release and manual stretching completed to upper arm, scapular, and shoulder region to decrease pain and restrictions.                    OT Education - 09/28/21 3704     Education Details AA/ROM supine. may stop previous HEP.    Person(s) Educated Patient    Methods Explanation;Demonstration;Verbal cues;Handout    Comprehension Verbalized understanding;Returned demonstration;Verbal cues required              OT Short Term Goals - 09/07/21 1610       OT SHORT TERM GOAL #1   Title Pt will be provided with and educated on HEP to improve mobility in RUE required  for ADL completion.    Time 6    Period Weeks    Status On-going    Target Date 10/17/21      OT SHORT TERM GOAL #2   Title Pt will decrease RUE fascial restrictions to minimal amounts or less to improve mobility required for overhead reaching tasks.    Time 6    Period Weeks    Status On-going    Target Date 10/17/21      OT SHORT TERM GOAL #3   Title Pt will decrease pain in RUE to 3/10 or less in order to sleep for 4 to 5+ consecutive hours without waking due to pain 75% of data opporutnieis.    Time 6    Period Weeks    Status On-going    Target Date 10/17/21      OT SHORT TERM GOAL #4   Title Patient will improve R UE P/ROM to [facility] in order to improve mobility for functional reaching tasks that are above shoulder height.    Time 6    Period Weeks    Status On-going    Target Date 03/06/22               OT Long Term Goals - 09/07/21 1610       OT LONG TERM GOAL #1   Title Patient will report using his R UE for ADL tasks including active ROM with 2/10 pain or less 75% of data opportunities.    Time 12    Period Weeks    Status On-going    Target Date 11/28/21      OT LONG TERM GOAL #2   Title Pt will increase A/ROM of RUE to [facility] to improve ability to reach overhead and behind back during dressing and bathing tasks.    Time 12    Period Weeks    Status On-going    Target Date 11/28/21      OT LONG TERM GOAL #3   Title Pt will increase strength in RUE to 4+/5 to improve ability to perform lifting tasks required for work and daily activities.    Time 12    Period Weeks    Status On-going    Target Date 11/28/20                   Plan -  09/28/21 0953     Clinical Impression Statement A: myofascial release completed to address fascial restrictions in the right upper arm, upper trapezius, and scapularis region. Measurements taken for MD appointment on 10/03/21. Patient demonstrates functional P/ROM at this time. progressed to week 5 starting AA/ROM  supine. Updated HEP. VC for form and technique were provided.    Body Structure / Function / Physical Skills ADL;Endurance;Fascial restriction;ROM;Pain;Strength;IADL;UE functional use    Plan P: Continue with AA/ROM. Progress to standing if able.    OT Home Exercise Plan 09/05/21: table slides flexion and abduction 1/11: AA/ROM supine.    Consulted and Agree with Plan of Care Patient             Patient will benefit from skilled therapeutic intervention in order to improve the following deficits and impairments:   Body Structure / Function / Physical Skills: ADL, Endurance, Fascial restriction, ROM, Pain, Strength, IADL, UE functional use       Visit Diagnosis: Stiffness of right shoulder, not elsewhere classified  Acute pain of right shoulder  Other symptoms and signs involving the musculoskeletal system    Problem List Patient Active Problem List   Diagnosis Date Noted   Allergy to alpha-gal    Hypothyroidism 08/28/2013   Hyperlipidemia    Obesity    Limmie PatriciaLaura Spero Gunnels, OTR/L,CBIS  [phone]  09/28/2021, 9:58 AM  [facility] [address] [place], [place], [address] Phone: [phone]   Fax:  [phone]  Name: Juan HartiganRobert Stone MRN: [id number] Date of Birth: October 21, 1960

## 2021-09-30 ENCOUNTER — Encounter (HOSPITAL_COMMUNITY): Payer: BC Managed Care – PPO

## 2021-10-03 ENCOUNTER — Ambulatory Visit (HOSPITAL_COMMUNITY): Payer: BC Managed Care – PPO

## 2021-10-03 ENCOUNTER — Encounter (HOSPITAL_COMMUNITY): Payer: Self-pay

## 2021-10-03 ENCOUNTER — Other Ambulatory Visit: Payer: Self-pay

## 2021-10-03 DIAGNOSIS — R29898 Other symptoms and signs involving the musculoskeletal system: Secondary | ICD-10-CM | POA: Diagnosis not present

## 2021-10-03 DIAGNOSIS — M25511 Pain in right shoulder: Secondary | ICD-10-CM | POA: Diagnosis not present

## 2021-10-03 DIAGNOSIS — M25611 Stiffness of right shoulder, not elsewhere classified: Secondary | ICD-10-CM | POA: Diagnosis not present

## 2021-10-03 NOTE — Therapy (Signed)
[facility] [address] [place], [place], [address] Phone: [phone]   Fax:  [phone]  Occupational Therapy Treatment  Patient Details  Name: Juan Stone MRN: [id number] Date of Birth: 1960-10-03 Referring Provider (OT): Elana Alm. Thurston Hole, M.D.   Encounter Date: 10/03/2021   OT End of Session - 10/03/21 1230     Visit Number 8    Number of Visits 24    Date for OT Re-Evaluation 11/28/21   mini reassess: 10/26/21   Authorization Type BSBS COMM PPO    Authorization Time Period no auth; 60 visit limit per calendar year    Authorization - Visit Number 5    Authorization - Number of Visits 60    OT Start Time 1132   Pt arrived late   OT Stop Time 1158    OT Time Calculation (min) 26 min    Activity Tolerance Patient tolerated treatment well    Behavior During Therapy WFL for tasks assessed/performed             Past Medical History:  Diagnosis Date   Allergy to alpha-gal    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 Coralyn Pear   CORNEAL TRANSPLANT     Fuch's dystorphy (bilateral)   HERNIA REPAIR N/A    Phreesia 05/25/2020    There were no vitals filed for this visit.   Subjective Assessment - 10/03/21 1141     Subjective  S: it's good today. Maybe a little sore.    Currently in Pain? No/denies                [facility] OT Assessment - 10/03/21 1142       Assessment   Medical Diagnosis s/p R shoulder biceps tenodisis, rotator cuff, partial labrum and partial bicepts tendon tear debridement, subacromial decompression, R shoulder DCE.      Precautions   Precautions Shoulder    Type of Shoulder Precautions Follow protocol from Dr. Thurston Hole. See media tab for protocol.    Shoulder Interventions Shoulder sling/immobilizer                       OT Treatments/Exercises (OP) - 10/03/21 1143       Exercises   Exercises Shoulder      Shoulder Exercises: Supine   Protraction PROM;5 reps    Horizontal ABduction PROM;5 reps    External Rotation PROM;5 reps    Internal Rotation PROM;5 reps    Flexion PROM;5 reps    ABduction PROM;5 reps      Shoulder Exercises: Standing   Protraction AAROM;10 reps    Horizontal ABduction AAROM;10 reps    External Rotation AAROM;10 reps    Internal Rotation AAROM;10 reps    Flexion AAROM;10 reps    ABduction AAROM;10 reps      Shoulder Exercises: Pulleys   Flexion 1 minute   seated   ABduction 1 minute   seated     Shoulder Exercises: Therapy Ball   Right/Left Other (comment)   3X right, 3X left     Shoulder Exercises: ROM/Strengthening   Wall Wash 1'                    OT Education -  10/03/21 1230     Education Details Completed HEP AA/ROM standing    Person(s) Educated Patient    Methods Explanation    Comprehension Verbalized understanding              OT Short Term Goals - 09/07/21 1610       OT SHORT TERM GOAL #1   Title Pt will be provided with and educated on HEP to improve mobility in RUE required for ADL completion.    Time 6    Period Weeks    Status On-going    Target Date 10/17/21      OT SHORT TERM GOAL #2   Title Pt will decrease RUE fascial restrictions to minimal amounts or less to improve mobility required for overhead reaching tasks.    Time 6    Period Weeks    Status On-going    Target Date 10/17/21      OT SHORT TERM GOAL #3   Title Pt will decrease pain in RUE to 3/10 or less in order to sleep for 4 to 5+ consecutive hours without waking due to pain 75% of data opporutnieis.    Time 6    Period Weeks    Status On-going    Target Date 10/17/21      OT SHORT TERM GOAL #4   Title Patient will improve R UE P/ROM to [facility] in order to improve mobility for functional reaching tasks that are above  shoulder height.    Time 6    Period Weeks    Status On-going    Target Date 03/06/22               OT Long Term Goals - 09/07/21 1610       OT LONG TERM GOAL #1   Title Patient will report using his R UE for ADL tasks including active ROM with 2/10 pain or less 75% of data opportunities.    Time 12    Period Weeks    Status On-going    Target Date 11/28/21      OT LONG TERM GOAL #2   Title Pt will increase A/ROM of RUE to [facility] to improve ability to reach overhead and behind back during dressing and bathing tasks.    Time 12    Period Weeks    Status On-going    Target Date 11/28/21      OT LONG TERM GOAL #3   Title Pt will increase strength in RUE to 4+/5 to improve ability to perform lifting tasks required for work and daily activities.    Time 12    Period Weeks    Status On-going    Target Date 11/28/20                   Plan - 10/03/21 1231     Clinical Impression Statement A: Due to time constraint myofascial release was not performed. Focused on increasing ROM during session while adding pvc pipe slide and therapy ball circles. Requires assist with therapy ball circles once behind back due to history of left shoulder surgery with limited ROM for internal rotation. VC for form and technique were provided during session. patient was able to achieve functional range with all exercises. Reports of pain with AA/ROM er.    Body Structure / Function / Physical Skills ADL;Endurance;Fascial restriction;ROM;Pain;Strength;IADL;UE functional use    Plan P: Follow up on MD appointment. Continue with AA/ROM. Increase repetitions to 12 if able.  OT Home Exercise Plan 09/05/21: table slides flexion and abduction 1/11: AA/ROM supine. 1/16: AA/ROM standing    Consulted and Agree with Plan of Care Patient             Patient will benefit from skilled therapeutic intervention in order to improve the following deficits and impairments:   Body Structure / Function /  Physical Skills: ADL, Endurance, Fascial restriction, ROM, Pain, Strength, IADL, UE functional use       Visit Diagnosis: Stiffness of right shoulder, not elsewhere classified  Other symptoms and signs involving the musculoskeletal system  Acute pain of right shoulder    Problem List Patient Active Problem List   Diagnosis Date Noted   Allergy to alpha-gal    Hypothyroidism 08/28/2013   Hyperlipidemia    Obesity     Limmie PatriciaLaura Raychell Holcomb, OTR/L,CBIS  [phone]  10/03/2021, 12:33 PM  [facility] [address] [place], [place], [address] Phone: [phone]   Fax:  [phone]  Name: Juan HartiganRobert Stone MRN: [id number] Date of Birth: 07-21-1961

## 2021-10-05 ENCOUNTER — Encounter (HOSPITAL_COMMUNITY): Payer: BC Managed Care – PPO | Admitting: Occupational Therapy

## 2021-10-07 ENCOUNTER — Encounter (HOSPITAL_COMMUNITY): Payer: BC Managed Care – PPO

## 2021-10-07 ENCOUNTER — Ambulatory Visit (HOSPITAL_COMMUNITY): Payer: BC Managed Care – PPO | Admitting: Occupational Therapy

## 2021-10-07 ENCOUNTER — Encounter (HOSPITAL_COMMUNITY): Payer: Self-pay | Admitting: Occupational Therapy

## 2021-10-07 ENCOUNTER — Other Ambulatory Visit: Payer: Self-pay

## 2021-10-07 DIAGNOSIS — M25511 Pain in right shoulder: Secondary | ICD-10-CM | POA: Diagnosis not present

## 2021-10-07 DIAGNOSIS — R29898 Other symptoms and signs involving the musculoskeletal system: Secondary | ICD-10-CM | POA: Diagnosis not present

## 2021-10-07 DIAGNOSIS — M25611 Stiffness of right shoulder, not elsewhere classified: Secondary | ICD-10-CM

## 2021-10-07 NOTE — Therapy (Signed)
[facility] [address] [place], [place], [address] Phone: [phone]   Fax:  [phone]  Occupational Therapy Treatment  Patient Details  Name: Juan Stone MRN: [id number] Date of Birth: 1960-12-23 Referring Provider (OT): Elana Alm. Thurston Hole, M.D.   Encounter Date: 10/07/2021   OT End of Session - 10/07/21 0813     Visit Number 9    Number of Visits 24    Date for OT Re-Evaluation 11/28/21   mini reassess: 10/26/21   Authorization Type BSBS COMM PPO    Authorization Time Period no auth; 60 visit limit per calendar year    Authorization - Visit Number 6    Authorization - Number of Visits 60    OT Start Time 0734    OT Stop Time [phone]    OT Time Calculation (min) 38 min    Activity Tolerance Patient tolerated treatment well    Behavior During Therapy WFL for tasks assessed/performed             Past Medical History:  Diagnosis Date   Allergy to alpha-gal    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 Coralyn Pear   CORNEAL TRANSPLANT     Fuch's dystorphy (bilateral)   HERNIA REPAIR N/A    [employer]reesia 05/25/2020    There were no vitals filed for this visit.   Subjective Assessment - 10/07/21 0736     Subjective  S: Just tight, no pain.    Currently in Pain? No/denies                [facility] OT Assessment - 10/07/21 0736       Assessment   Medical Diagnosis s/p R shoulder biceps tenodisis, rotator cuff, partial labrum and partial bicepts tendon tear debridement, subacromial decompression, R shoulder DCE.      Precautions   Precautions Shoulder    Type of Shoulder Precautions Follow protocol from Dr. Thurston Hole. See media tab for protocol.    Shoulder Interventions Shoulder sling/immobilizer                       OT Treatments/Exercises (OP) - 10/07/21 0736       Exercises   Exercises Shoulder      Shoulder Exercises: Supine   Protraction PROM;5 reps;AAROM;12 reps    Horizontal ABduction PROM;5 reps;AAROM;12 reps    External Rotation PROM;5 reps;AAROM;12 reps    Internal Rotation PROM;5 reps;AAROM;12 reps    Flexion PROM;5 reps;AAROM;12 reps    ABduction PROM;5 reps;AAROM;12 reps      Shoulder Exercises: Standing   Protraction AAROM;12 reps    Horizontal ABduction AAROM;12 reps    External Rotation AAROM;12 reps    Internal Rotation AAROM;12 reps    Flexion AAROM;12 reps    ABduction AAROM;12 reps    Extension Theraband;10 reps    Row Theraband;10 reps    Retraction Theraband;10 reps    Theraband Level (Shoulder Retraction) Level 2 (Red)      Shoulder Exercises: ROM/Strengthening   UBE (Upper Arm Bike) Level 1 2' forward, pace: 4.0      Manual Therapy   Manual Therapy Myofascial release    Manual therapy comments Myofascial release manual therapy completed seperately from all other interventions.  Myofascial Release myofascial release and manual stretching completed to upper arm, scapular, and shoulder region to decrease pain and restrictions.                      OT Short Term Goals - 09/07/21 1610       OT SHORT TERM GOAL #1   Title Pt will be provided with and educated on HEP to improve mobility in RUE required for ADL completion.    Time 6    Period Weeks    Status On-going    Target Date 10/17/21      OT SHORT TERM GOAL #2   Title Pt will decrease RUE fascial restrictions to minimal amounts or less to improve mobility required for overhead reaching tasks.    Time 6    Period Weeks    Status On-going    Target Date 10/17/21      OT SHORT TERM GOAL #3   Title Pt will decrease pain in RUE to 3/10 or less in order to sleep for 4 to 5+ consecutive hours without waking due to pain 75% of data opporutnieis.    Time 6    Period Weeks    Status On-going     Target Date 10/17/21      OT SHORT TERM GOAL #4   Title Patient will improve R UE P/ROM to [facility] in order to improve mobility for functional reaching tasks that are above shoulder height.    Time 6    Period Weeks    Status On-going    Target Date 03/06/22               OT Long Term Goals - 09/07/21 1610       OT LONG TERM GOAL #1   Title Patient will report using his R UE for ADL tasks including active ROM with 2/10 pain or less 75% of data opportunities.    Time 12    Period Weeks    Status On-going    Target Date 11/28/21      OT LONG TERM GOAL #2   Title Pt will increase A/ROM of RUE to [facility] to improve ability to reach overhead and behind back during dressing and bathing tasks.    Time 12    Period Weeks    Status On-going    Target Date 11/28/21      OT LONG TERM GOAL #3   Title Pt will increase strength in RUE to 4+/5 to improve ability to perform lifting tasks required for work and daily activities.    Time 12    Period Weeks    Status On-going    Target Date 11/28/20                   Plan - 10/07/21 0750     Clinical Impression Statement A: Pt reports MD appt went well, no sling required now and has instructions to only complete very gentle and lightweight lifting tasks at home. Resumed myofascial release to address fascial restrictions, P/ROM completed. Continued with AA/ROM in supine and standing, increasing repetitions to 12. Added scapular theraband and UBE today. Verbal cuing for form and technique.    Body Structure / Function / Physical Skills ADL;Endurance;Fascial restriction;ROM;Pain;Strength;IADL;UE functional use    Plan P: Attempt A/ROM supine, Update HEP as pt will be away for work for the next week.    OT Home Exercise Plan 09/05/21: table slides flexion and abduction 1/11: AA/ROM supine.  1/16: AA/ROM standing    Consulted and Agree with Plan of Care Patient             Patient will benefit from skilled therapeutic intervention in  order to improve the following deficits and impairments:   Body Structure / Function / Physical Skills: ADL, Endurance, Fascial restriction, ROM, Pain, Strength, IADL, UE functional use       Visit Diagnosis: Stiffness of right shoulder, not elsewhere classified  Other symptoms and signs involving the musculoskeletal system  Acute pain of right shoulder    Problem List Patient Active Problem List   Diagnosis Date Noted   Allergy to alpha-gal    Hypothyroidism 08/28/2013   Hyperlipidemia    Obesity     Ezra SitesLeslie Kazoua Gossen, OTR/L  [phone] 10/07/2021, 8:14 AM  [facility] [address] [place], [place], [address] Phone: [phone]   Fax:  [phone]  Name: Juan HartiganRobert Stone MRN: [id number] Date of Birth: 1961/03/09

## 2021-10-10 ENCOUNTER — Encounter (HOSPITAL_COMMUNITY): Payer: BC Managed Care – PPO

## 2021-10-12 ENCOUNTER — Encounter (HOSPITAL_COMMUNITY): Payer: BC Managed Care – PPO | Admitting: Occupational Therapy

## 2021-10-14 ENCOUNTER — Encounter (HOSPITAL_COMMUNITY): Payer: BC Managed Care – PPO

## 2021-10-14 ENCOUNTER — Ambulatory Visit (HOSPITAL_COMMUNITY): Payer: BC Managed Care – PPO | Admitting: Occupational Therapy

## 2021-10-14 ENCOUNTER — Encounter (HOSPITAL_COMMUNITY): Payer: Self-pay | Admitting: Occupational Therapy

## 2021-10-14 ENCOUNTER — Other Ambulatory Visit: Payer: Self-pay

## 2021-10-14 DIAGNOSIS — M25511 Pain in right shoulder: Secondary | ICD-10-CM | POA: Diagnosis not present

## 2021-10-14 DIAGNOSIS — M25611 Stiffness of right shoulder, not elsewhere classified: Secondary | ICD-10-CM | POA: Diagnosis not present

## 2021-10-14 DIAGNOSIS — R29898 Other symptoms and signs involving the musculoskeletal system: Secondary | ICD-10-CM | POA: Diagnosis not present

## 2021-10-14 NOTE — Patient Instructions (Signed)

## 2021-10-14 NOTE — Therapy (Signed)
[facility] [address] [place], [place], [address] Phone: [phone]   Fax:  [phone]  Occupational Therapy Treatment  Patient Details  Name: Juan HartiganRobert Stone MRN: [id number] Date of Birth: 04/13/1961 Referring Provider (OT): Elana Almobert A. Thurston HoleWainer, M.D.   Encounter Date: 10/14/2021   OT End of Session - 10/14/21 0813     Visit Number 10    Number of Visits 24    Date for OT Re-Evaluation 11/28/21   mini reassess: 10/26/21   Authorization Type BSBS COMM PPO    Authorization Time Period no auth; 60 visit limit per calendar year    Authorization - Visit Number 7    Authorization - Number of Visits 60    OT Start Time 0740    OT Stop Time 0819    OT Time Calculation (min) 39 min    Activity Tolerance Patient tolerated treatment well    Behavior During Therapy WFL for tasks assessed/performed             Past Medical History:  Diagnosis Date   Allergy to alpha-gal    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 [facility]ans [facility]   CORNEAL TRANSPLANT     Fuch's dystorphy (bilateral)   HERNIA REPAIR N/A    Phreesia 05/25/2020    There were no vitals filed for this visit.   Subjective Assessment - 10/14/21 0741     Subjective  S: Certain movements cause a little pain but nothing constant.    Currently in Pain? No/denies                [facility] - 10/14/21 0741       Assessment   Medical Diagnosis s/p R shoulder biceps tenodisis, rotator cuff, partial labrum and partial bicepts tendon tear debridement, subacromial decompression, R shoulder DCE.      Precautions   Precautions Shoulder    Type of Shoulder Precautions 1/26: progress as tolerated    Shoulder Interventions --                      OT  Treatments/Exercises (OP) - 10/14/21 0741       Exercises   Exercises Shoulder      Shoulder Exercises: Supine   Protraction PROM;5 reps;AROM;12 reps    Horizontal ABduction PROM;5 reps;AAROM;12 reps    External Rotation PROM;5 reps;AAROM;12 reps    Internal Rotation PROM;5 reps;AAROM;12 reps    Flexion PROM;5 reps;AAROM;12 reps    ABduction PROM;5 reps;AAROM;12 reps      Shoulder Exercises: Standing   Protraction AROM;12 reps    Horizontal ABduction AROM;12 reps    External Rotation AROM;12 reps    Internal Rotation AROM;12 reps    Flexion AROM;12 reps    ABduction AROM;12 reps    Extension Theraband;10 reps    Theraband Level (Shoulder Extension) Level 2 (Red)    Row Theraband;10 reps    Theraband Level (Shoulder Row) Level 2 (Red)    Retraction Theraband;10 reps    Theraband Level (Shoulder Retraction) Level 2 (Red)      Shoulder Exercises: ROM/Strengthening   UBE (Upper Arm Bike) Level 1 3' forward 3' reverse, pace: 5.0    Thumb Tacks 2' seated  Proximal Shoulder Strengthening, Supine 10X each, no rest break    Proximal Shoulder Strengthening, Seated 10X each, no rest break    Other ROM/Strengthening Exercises proximal shoulder strengthening on doorway, 1' flexion      Shoulder Exercises: Stretch   Internal Rotation Stretch 2 reps   10" horizontal towel   Wall Stretch - Flexion 2 reps;10 seconds    Wall Stretch - ABduction 2 reps;10 seconds    Other Shoulder Stretches doorway stretch, 2x10"                    OT Education - 10/14/21 0747     Education Details shoulder A/ROM    Person(s) Educated Patient    Methods Explanation;Demonstration;Handout    Comprehension Verbalized understanding;Returned demonstration              OT Short Term Goals - 09/07/21 1610       OT SHORT TERM GOAL #1   Title Pt will be provided with and educated on HEP to improve mobility in RUE required for ADL completion.    Time 6    Period Weeks    Status On-going     Target Date 10/17/21      OT SHORT TERM GOAL #2   Title Pt will decrease RUE fascial restrictions to minimal amounts or less to improve mobility required for overhead reaching tasks.    Time 6    Period Weeks    Status On-going    Target Date 10/17/21      OT SHORT TERM GOAL #3   Title Pt will decrease pain in RUE to 3/10 or less in order to sleep for 4 to 5+ consecutive hours without waking due to pain 75% of data opporutnieis.    Time 6    Period Weeks    Status On-going    Target Date 10/17/21      OT SHORT TERM GOAL #4   Title Patient will improve R UE P/ROM to [facility] in order to improve mobility for functional reaching tasks that are above shoulder height.    Time 6    Period Weeks    Status On-going    Target Date 03/06/22               OT Long Term Goals - 09/07/21 1610       OT LONG TERM GOAL #1   Title Patient will report using his R UE for ADL tasks including active ROM with 2/10 pain or less 75% of data opportunities.    Time 12    Period Weeks    Status On-going    Target Date 11/28/21      OT LONG TERM GOAL #2   Title Pt will increase A/ROM of RUE to [facility] to improve ability to reach overhead and behind back during dressing and bathing tasks.    Time 12    Period Weeks    Status On-going    Target Date 11/28/21      OT LONG TERM GOAL #3   Title Pt will increase strength in RUE to 4+/5 to improve ability to perform lifting tasks required for work and daily activities.    Time 12    Period Weeks    Status On-going    Target Date 11/28/20                   Plan - 10/14/21 0747     Clinical Impression Statement A: Pt reports pain  with certain movements at times. No manual therapy completed as pt was a few minutes late today. Continued with passive stretching and progressed to A/ROM in supine and standing. Pt with ROM WFL during tasks, reports occasional pain and muscle tightness. Continued scapular theraband and added overhead lacing and  shoulder stretches. Verbal cuing for form and technique.    Body Structure / Function / Physical Skills ADL;Endurance;Fascial restriction;ROM;Pain;Strength;IADL;UE functional use    Plan P: Follow up on HEP, add x to v arms, functional reaching overhead, progress to green scapular theraband and add to HEP    OT Home Exercise Plan 09/05/21: table slides flexion and abduction 1/11: AA/ROM supine. 1/16: AA/ROM standing; 1/27: shoulder A/ROM    Consulted and Agree with Plan of Care Patient             Patient will benefit from skilled therapeutic intervention in order to improve the following deficits and impairments:   Body Structure / Function / Physical Skills: ADL, Endurance, Fascial restriction, ROM, Pain, Strength, IADL, UE functional use       Visit Diagnosis: Stiffness of right shoulder, not elsewhere classified  Other symptoms and signs involving the musculoskeletal system  Acute pain of right shoulder    Problem List Patient Active Problem List   Diagnosis Date Noted   Allergy to alpha-gal    Hypothyroidism 08/28/2013   Hyperlipidemia    Obesity     Ezra Sites, OTR/L  [phone] 10/14/2021, 8:20 AM  [facility] [address] [place], [place], [address] Phone: [phone]   Fax:  [phone]  Name: Juan Stone MRN: [id number] Date of Birth: 04/22/1961

## 2021-10-17 ENCOUNTER — Ambulatory Visit (HOSPITAL_COMMUNITY): Payer: BC Managed Care – PPO | Admitting: Occupational Therapy

## 2021-10-17 ENCOUNTER — Encounter (HOSPITAL_COMMUNITY): Payer: Self-pay | Admitting: Occupational Therapy

## 2021-10-17 ENCOUNTER — Other Ambulatory Visit: Payer: Self-pay

## 2021-10-17 ENCOUNTER — Encounter (HOSPITAL_COMMUNITY): Payer: BC Managed Care – PPO

## 2021-10-17 DIAGNOSIS — R29898 Other symptoms and signs involving the musculoskeletal system: Secondary | ICD-10-CM | POA: Diagnosis not present

## 2021-10-17 DIAGNOSIS — M25611 Stiffness of right shoulder, not elsewhere classified: Secondary | ICD-10-CM | POA: Diagnosis not present

## 2021-10-17 DIAGNOSIS — M25511 Pain in right shoulder: Secondary | ICD-10-CM

## 2021-10-17 NOTE — Therapy (Signed)
[facility] [address] [place], [place], [address] Phone: [phone]   Fax:  [phone]  Occupational Therapy Treatment  Patient Details  Name: Juan Stone MRN: [id number] Date of Birth: [birth year] Referring Provider (OT): Elana Alm. Thurston Hole, M.D.   Encounter Date: 10/17/2021   OT End of Session - 10/17/21 1036     Visit Number 11    Number of Visits 24    Date for OT Re-Evaluation 11/28/21   mini reassess: 10/26/21   Authorization Type BSBS COMM PPO    Authorization Time Period no auth; 60 visit limit per calendar year    Authorization - Visit Number 8    Authorization - Number of Visits 60    OT Start Time [phone]    OT Stop Time 1030    OT Time Calculation (min) 40 min    Activity Tolerance Patient tolerated treatment well    Behavior During Therapy WFL for tasks assessed/performed             Past Medical History:  Diagnosis Date   Allergy to alpha-gal    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 Coralyn Pear   CORNEAL TRANSPLANT     Fuch's dystorphy (bilateral)   HERNIA REPAIR N/A    Phreesia 05/25/2020    There were no vitals filed for this visit.   Subjective Assessment - 10/17/21 0949     Subjective  S: Certain movements cause pain.    Currently in Pain? No/denies                [facility] OT Assessment - 10/17/21 0001       Assessment   Medical Diagnosis s/p R shoulder biceps tenodisis, rotator cuff, partial labrum and partial bicepts tendon tear debridement, subacromial decompression, R shoulder DCE.      Precautions   Precautions Shoulder    Type of Shoulder Precautions 1/26: progress as tolerated                      OT Treatments/Exercises (OP) - 10/17/21 0001       Exercises   Exercises  Shoulder      Shoulder Exercises: Supine   Protraction PROM;5 reps;AROM;12 reps    Horizontal ABduction PROM;5 reps;12 reps;AROM    External Rotation PROM;5 reps;12 reps;AROM    Internal Rotation PROM;5 reps;12 reps;AROM    Flexion PROM;5 reps;12 reps;AROM    ABduction PROM;5 reps;12 reps;AROM      Shoulder Exercises: Standing   Protraction AROM;12 reps    Horizontal ABduction AROM;12 reps    External Rotation AROM;12 reps    Internal Rotation AROM;12 reps    Flexion AROM;12 reps    ABduction AROM;12 reps    Extension Theraband;10 reps    Theraband Level (Shoulder Extension) Level 3 (Green)    Row [employer] reps    Theraband Level (Shoulder Row) Level 3 (Green)    Retraction Theraband;10 reps    Theraband Level (Shoulder Retraction) Level 3 (Green)      Shoulder Exercises: ROM/Strengthening   UBE (Upper Arm Bike) Level 1 3' forward 3' reverse, pace: 6.5    Over Head Lace lacing rope down chain and ulacing it back up while seated  X to V Arms x12 reps standing    Proximal Shoulder Strengthening, Supine 10X each, no rest break    Proximal Shoulder Strengthening, Seated 10X each, no rest break    Ball on Wall 1' flexion 1' abduction standing      Functional Reaching Activities   High Level Placing pegs on peg board while seated reaching at high level overhead. Completed fall all but the last 2 rows of peg board.                    OT Education - 10/17/21 1035     Education Details scapular strengthening with green band    Person(s) Educated Patient    Methods Explanation;Demonstration;Handout    Comprehension Verbalized understanding;Returned demonstration              OT Short Term Goals - 09/07/21 1610       OT SHORT TERM GOAL #1   Title Pt will be provided with and educated on HEP to improve mobility in RUE required for ADL completion.    Time 6    Period Weeks    Status On-going    Target Date 10/17/21      OT SHORT TERM GOAL #2   Title Pt  will decrease RUE fascial restrictions to minimal amounts or less to improve mobility required for overhead reaching tasks.    Time 6    Period Weeks    Status On-going    Target Date 10/17/21      OT SHORT TERM GOAL #3   Title Pt will decrease pain in RUE to 3/10 or less in order to sleep for 4 to 5+ consecutive hours without waking due to pain 75% of data opporutnieis.    Time 6    Period Weeks    Status On-going    Target Date 10/17/21      OT SHORT TERM GOAL #4   Title Patient will improve R UE P/ROM to [facility] in order to improve mobility for functional reaching tasks that are above shoulder height.    Time 6    Period Weeks    Status On-going    Target Date 03/06/22               OT Long Term Goals - 09/07/21 1610       OT LONG TERM GOAL #1   Title Patient will report using his R UE for ADL tasks including active ROM with 2/10 pain or less 75% of data opportunities.    Time 12    Period Weeks    Status On-going    Target Date 11/28/21      OT LONG TERM GOAL #2   Title Pt will increase A/ROM of RUE to [facility] to improve ability to reach overhead and behind back during dressing and bathing tasks.    Time 12    Period Weeks    Status On-going    Target Date 11/28/21      OT LONG TERM GOAL #3   Title Pt will increase strength in RUE to 4+/5 to improve ability to perform lifting tasks required for work and daily activities.    Time 12    Period Weeks    Status On-going    Target Date 11/28/20                   Plan - 10/17/21 1037     Clinical Impression Statement A: Pt continues to report pain  in certain movements. Pt was slightly late today so session started with P/ROM and progressed to A/ROM and proximal shoulder girdle strengthening. Pt  complted novel ball on wall exercise and scapular strengthening with the green band. Pt provided HEP for these exercises. Pt completed several reps of functional reaching with chain and pegs. Verbal cuing for form and  technique.    Body Structure / Function / Physical Skills ADL;Endurance;Fascial restriction;ROM;Pain;Strength;IADL;UE functional use    Plan P: Follow up on use of HEP. Possibly increase to light strengthening with dumbbells.    OT Home Exercise Plan 09/05/21: table slides flexion and abduction 1/11: AA/ROM supine. 1/16: AA/ROM standing; 1/27: shoulder A/ROM; 1/30: Green band scapular strengthening    Consulted and Agree with Plan of Care Patient             Patient will benefit from skilled therapeutic intervention in order to improve the following deficits and impairments:   Body Structure / Function / Physical Skills: ADL, Endurance, Fascial restriction, ROM, Pain, Strength, IADL, UE functional use       Visit Diagnosis: Stiffness of right shoulder, not elsewhere classified  Other symptoms and signs involving the musculoskeletal system  Acute pain of right shoulder    Problem List Patient Active Problem List   Diagnosis Date Noted   Allergy to alpha-gal    Hypothyroidism 08/28/2013   Hyperlipidemia    Obesity    Danie ChandlerSAMUEL Kassi Esteve OT, MOT  Danie ChandlerSamuel  Lorece Keach, OT 10/17/2021, 12:28 PM  [facility] [address] [place], [place], [address] Phone: [phone]   Fax:  [phone]  Name: Juan Stone MRN: [id number] Date of Birth: [birth year]

## 2021-10-17 NOTE — Patient Instructions (Signed)

## 2021-10-19 ENCOUNTER — Encounter (HOSPITAL_COMMUNITY): Payer: BC Managed Care – PPO

## 2021-10-19 ENCOUNTER — Other Ambulatory Visit: Payer: Self-pay

## 2021-10-19 ENCOUNTER — Encounter (HOSPITAL_COMMUNITY): Payer: Self-pay | Admitting: Occupational Therapy

## 2021-10-19 ENCOUNTER — Ambulatory Visit (HOSPITAL_COMMUNITY): Payer: BC Managed Care – PPO | Attending: Orthopedic Surgery | Admitting: Occupational Therapy

## 2021-10-19 DIAGNOSIS — M25611 Stiffness of right shoulder, not elsewhere classified: Secondary | ICD-10-CM

## 2021-10-19 DIAGNOSIS — M25511 Pain in right shoulder: Secondary | ICD-10-CM

## 2021-10-19 DIAGNOSIS — R29898 Other symptoms and signs involving the musculoskeletal system: Secondary | ICD-10-CM

## 2021-10-19 NOTE — Therapy (Signed)
[facility] [address] [place], [place], [address] Phone: [phone]   Fax:  [phone]  Occupational Therapy Treatment  Patient Details  Name: Juan Stone MRN: [id number] Date of Birth: November 02, 1960 Referring Provider (OT): Elana Alm. Thurston Hole, M.D.   Encounter Date: 10/19/2021   OT End of Session - 10/19/21 0811     Visit Number 12    Number of Visits 24    Date for OT Re-Evaluation 11/28/21   mini reassess: 10/26/21   Authorization Type BSBS COMM PPO    Authorization Time Period no auth; 60 visit limit per calendar year    Authorization - Visit Number 9    Authorization - Number of Visits 60    OT Start Time [phone]    OT Stop Time 0815    OT Time Calculation (min) 40 min    Activity Tolerance Patient tolerated treatment well    Behavior During Therapy WFL for tasks assessed/performed             Past Medical History:  Diagnosis Date   Allergy to alpha-gal    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 Coralyn Pear   CORNEAL TRANSPLANT     Fuch's dystorphy (bilateral)   HERNIA REPAIR N/A    Phreesia 05/25/2020    There were no vitals filed for this visit.   Subjective Assessment - 10/19/21 0735     Subjective  S: I have a catch right here. (around [facility] joint)    Currently in Pain? Yes    Pain Score 5     Pain Location Shoulder    Pain Orientation Right    Pain Descriptors / Indicators Aching;Sore    Pain Type Acute pain    Pain Radiating Towards N/A    Pain Onset In the past 7 days    Pain Frequency Constant    Aggravating Factors  certain movements    Pain Relieving Factors medication    Effect of Pain on Daily Activities min effect on ADLs                [facility] OT Assessment - 10/19/21 0735       Assessment    Medical Diagnosis s/p R shoulder biceps tenodisis, rotator cuff, partial labrum and partial bicepts tendon tear debridement, subacromial decompression, R shoulder DCE.      Precautions   Precautions Shoulder    Type of Shoulder Precautions 1/26: progress as tolerated                      OT Treatments/Exercises (OP) - 10/19/21 0741       Exercises   Exercises Shoulder      Shoulder Exercises: Supine   Protraction PROM;5 reps;Strengthening;10 reps    Protraction Weight (lbs) 1    Horizontal ABduction PROM;5 reps;Strengthening;10 reps    Horizontal ABduction Weight (lbs) 1    External Rotation PROM;5 reps;Strengthening;10 reps    External Rotation Weight (lbs) 1    Internal Rotation PROM;5 reps;Strengthening;10 reps    Internal Rotation Weight (lbs) 1    Flexion PROM;5 reps;Strengthening;10 reps    Shoulder Flexion Weight (lbs) 1    ABduction PROM;5 reps;Strengthening;10 reps    Shoulder ABduction Weight (lbs)  1      Shoulder Exercises: Standing   Protraction Strengthening;10 reps    Protraction Weight (lbs) 1    Horizontal ABduction Strengthening;10 reps    Horizontal ABduction Weight (lbs) 1    External Rotation Strengthening;10 reps    External Rotation Weight (lbs) 1    Internal Rotation Strengthening;10 reps    Internal Rotation Weight (lbs) 1    Flexion Strengthening;10 reps    Shoulder Flexion Weight (lbs) 1    ABduction Strengthening;10 reps    Shoulder ABduction Weight (lbs) 1    Extension Theraband;15 reps    Theraband Level (Shoulder Extension) Level 3 (Green)    Row [employer] reps    Theraband Level (Shoulder Row) Level 3 (Green)    Retraction Theraband;15 reps    Theraband Level (Shoulder Retraction) Level 3 (Green)      Shoulder Exercises: Therapy Ball   Other Therapy Ball Exercises green therapy ball: chest press, flexion, overhead press, circles each direction. 10X each      Shoulder Exercises: ROM/Strengthening   UBE (Upper Arm Bike)  Level 2 3' forward 3' reverse, pace: 6.5    Over Head Lace 2' seated    X to V Arms 10X, 1#    Proximal Shoulder Strengthening, Supine 10X each, 1#, no rest break    Proximal Shoulder Strengthening, Seated 10X each, 1#, no rest break    Ball on Wall 1' flexion 1' abduction green ball                      OT Short Term Goals - 09/07/21 1610       OT SHORT TERM GOAL #1   Title Pt will be provided with and educated on HEP to improve mobility in RUE required for ADL completion.    Time 6    Period Weeks    Status On-going    Target Date 10/17/21      OT SHORT TERM GOAL #2   Title Pt will decrease RUE fascial restrictions to minimal amounts or less to improve mobility required for overhead reaching tasks.    Time 6    Period Weeks    Status On-going    Target Date 10/17/21      OT SHORT TERM GOAL #3   Title Pt will decrease pain in RUE to 3/10 or less in order to sleep for 4 to 5+ consecutive hours without waking due to pain 75% of data opporutnieis.    Time 6    Period Weeks    Status On-going    Target Date 10/17/21      OT SHORT TERM GOAL #4   Title Patient will improve R UE P/ROM to [facility] in order to improve mobility for functional reaching tasks that are above shoulder height.    Time 6    Period Weeks    Status On-going    Target Date 03/06/22               OT Long Term Goals - 09/07/21 1610       OT LONG TERM GOAL #1   Title Patient will report using his R UE for ADL tasks including active ROM with 2/10 pain or less 75% of data opportunities.    Time 12    Period Weeks    Status On-going    Target Date 11/28/21      OT LONG TERM GOAL #2   Title Pt will increase A/ROM of RUE to [facility]  to improve ability to reach overhead and behind back during dressing and bathing tasks.    Time 12    Period Weeks    Status On-going    Target Date 11/28/21      OT LONG TERM GOAL #3   Title Pt will increase strength in RUE to 4+/5 to improve ability to perform  lifting tasks required for work and daily activities.    Time 12    Period Weeks    Status On-going    Target Date 11/28/20                   Plan - 10/19/21 0749     Clinical Impression Statement A: Pt reports HEP is going well, continued with passive stretching today and progressed to strengthening. Using 1# weights in supine and standing, pt with ROM WFL in all planes. Added therapy ball strengthening and continued with green scapular theraband and ball on wall. Verbal cuing for form and technique.    Body Structure / Function / Physical Skills ADL;Endurance;Fascial restriction;ROM;Pain;Strength;IADL;UE functional use    Plan P: Mini-reassessment-determine if pt is ready to discharge with HEP, go to once/week, or needs to continue 2x/week    OT Home Exercise Plan 09/05/21: table slides flexion and abduction 1/11: AA/ROM supine. 1/16: AA/ROM standing; 1/27: shoulder A/ROM; 1/30: Green band scapular strengthening    Consulted and Agree with Plan of Care Patient             Patient will benefit from skilled therapeutic intervention in order to improve the following deficits and impairments:   Body Structure / Function / Physical Skills: ADL, Endurance, Fascial restriction, ROM, Pain, Strength, IADL, UE functional use       Visit Diagnosis: Stiffness of right shoulder, not elsewhere classified  Other symptoms and signs involving the musculoskeletal system  Acute pain of right shoulder    Problem List Patient Active Problem List   Diagnosis Date Noted   Allergy to alpha-gal    Hypothyroidism 08/28/2013   Hyperlipidemia    Obesity     Ezra SitesLeslie Morey Andonian, OTR/L  [phone] 10/19/2021, 8:16 AM  [facility] [address] [place], [place], [address] Phone: [phone]   Fax:  [phone]  Name: Juan HartiganRobert Stone MRN: [id number] Date of Birth: [birth year]

## 2021-10-21 ENCOUNTER — Ambulatory Visit (HOSPITAL_COMMUNITY): Payer: BC Managed Care – PPO

## 2021-10-21 ENCOUNTER — Encounter (HOSPITAL_COMMUNITY): Payer: BC Managed Care – PPO

## 2021-10-22 DIAGNOSIS — G4733 Obstructive sleep apnea (adult) (pediatric): Secondary | ICD-10-CM | POA: Diagnosis not present

## 2021-10-24 ENCOUNTER — Ambulatory Visit (HOSPITAL_COMMUNITY): Payer: BC Managed Care – PPO

## 2021-10-24 ENCOUNTER — Encounter (HOSPITAL_COMMUNITY): Payer: Self-pay

## 2021-10-24 ENCOUNTER — Other Ambulatory Visit: Payer: Self-pay

## 2021-10-24 DIAGNOSIS — M25511 Pain in right shoulder: Secondary | ICD-10-CM | POA: Diagnosis not present

## 2021-10-24 DIAGNOSIS — M25611 Stiffness of right shoulder, not elsewhere classified: Secondary | ICD-10-CM

## 2021-10-24 DIAGNOSIS — R29898 Other symptoms and signs involving the musculoskeletal system: Secondary | ICD-10-CM

## 2021-10-24 NOTE — Patient Instructions (Signed)
Complete the following exercises 2-3 times a da or as needed.   Doorway Stretch  Place each hand opposite each other on the doorway. (You can change where you feel the stretch by moving arms higher or lower.) Step through with one foot and bend front knee until a stretch is felt and hold. Step through with the opposite foot on the next rep. Hold for __20-30___ seconds. Repeat __2__times.     Internal Rotation Across Back  Grab the end of a towel with your affected side, palm facing backwards. Grab the towel with your unaffected side and pull your affected hand across your back until you feel a stretch in the front of your shoulder. If you feel pain, pull just to the pain, do not pull through the pain. Hold. Return your affected arm to your side. Try to keep your hand/arm close to your body during the entire movement.     Hold for 20-30 seconds. Complete 2 times.          Wall Flexion  Slide your arm up the wall or door frame until a stretch is felt in your shoulder . Hold for 20-30 seconds. Complete 2 times     Shoulder Abduction Stretch  Stand side ways by a wall with affected up on wall. Gently step in toward wall to feel stretch. Hold for 20-30 seconds. Complete 2 times.   `

## 2021-10-25 NOTE — Therapy (Signed)
[place], [place], [address] Phone: [phone]   Fax:  [phone]  Occupational Therapy Treatment  Patient Details  Name: Juan Stone MRN: [id number] Date of Birth: 1961/01/05 Referring Provider (OT): Audree Camel. Noemi Chapel, M.D.   Encounter Date: 10/24/2021   OT End of Session - 10/24/21 1421     Visit Number 13    Number of Visits 24    Date for OT Re-Evaluation 11/28/21   mini reassess: 10/26/21   Authorization Type BSBS COMM PPO    Authorization Time Period no auth; 60 visit limit per calendar year    Authorization - Visit Number 10    Authorization - Number of Visits 36    OT Start Time 1300   reassess and discharge   OT Stop Time 1332    OT Time Calculation (min) 32 min    Activity Tolerance Patient tolerated treatment well    Behavior During Therapy WFL for tasks assessed/performed             Past Medical History:  Diagnosis Date   Allergy to alpha-gal    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 Fausto Skillern   CORNEAL TRANSPLANT     Fuch's dystorphy (bilateral)   HERNIA REPAIR N/A    [employer]reesia 05/25/2020    There were no vitals filed for this visit.   Subjective Assessment - 10/24/21 1341     Subjective  S: I have a popping sensation with certain movements.    Currently in Pain? Yes    Pain Score 5     Pain Location Shoulder    Pain Orientation Right    Pain Descriptors / Indicators Sore    Pain Type Acute pain    Pain Onset 1 to 4 weeks ago    Pain Frequency Intermittent    Aggravating Factors  certain movements    Pain Relieving Factors medication    Effect of Pain on Daily Activities min effect                10/24/21 0001  Assessment  Medical Diagnosis s/p R shoulder biceps  tenodisis, rotator cuff, partial labrum and partial bicepts tendon tear debridement, subacromial decompression, R shoulder DCE.  Precautions  Precautions Shoulder  Type of Shoulder Precautions 1/26: progress as tolerated  Observation/Other Assessments  Focus on Therapeutic Outcomes (FOTO)  62/100  ROM / Strength  AROM / PROM / Strength AROM;PROM;Strength  AROM  Right/Left Shoulder Right  Right Shoulder ABduction 150 Degrees  Right Shoulder External Rotation 80 Degrees  Right Shoulder Internal Rotation 90 Degrees  Right Shoulder Flexion 141 Degrees  AROM Assessment Site Shoulder  Overall AROM Comments Assessed standing. IR/er adducted. A/ROM not assessed prior to this date.  PROM  Right Shoulder Flexion 150 Degrees (previous: 140)  Right Shoulder ABduction 180 Degrees (previous: same)  Right Shoulder Internal Rotation 90 Degrees (previous: same)  Right Shoulder External Rotation 60 Degrees (previous: 55)  Overall PROM Comments Assessed supine. IR/er adducted  PROM Assessment Site Shoulder  Right/Left Shoulder Right  Strength  Right/Left Shoulder Right  Right Shoulder Flexion 4+/5  Right Shoulder ABduction 5/5  Right Shoulder External Rotation 5/5  Right Shoulder Internal Rotation 5/5  Strength Assessment Site  Shoulder  Overall Strength Comments Assessed standing. IR/er adducted. Strength not assessed prior to this date.       10/24/21 0001  Exercises  Exercises Shoulder  Shoulder Exercises: Supine  Protraction PROM;5 reps  Horizontal ABduction PROM;5 reps  External Rotation PROM;5 reps  Internal Rotation PROM;5 reps  Flexion PROM;5 reps  ABduction PROM;5 reps  Shoulder Exercises: Stretch  Internal Rotation Stretch 2 reps (horizontal towel 20")  Wall Stretch - Flexion 2 reps;20 seconds  Wall Stretch - ABduction 2 reps;20 seconds  Other Shoulder Stretches doorway stretch, 2x20"                   OT Education - 10/24/21 1341     Education Details  Shoulder stretches    Person(s) Educated Patient    Methods Explanation;Demonstration;Handout;Verbal cues    Comprehension Verbalized understanding;Returned demonstration              OT Short Term Goals - 10/24/21 1318       OT SHORT TERM GOAL #1   Title Pt will be provided with and educated on HEP to improve mobility in RUE required for ADL completion.    Time 6    Period Weeks    Status Achieved    Target Date 10/17/21      OT SHORT TERM GOAL #2   Title Pt will decrease RUE fascial restrictions to minimal amounts or less to improve mobility required for overhead reaching tasks.    Time 6    Period Weeks    Status Achieved    Target Date 10/17/21      OT SHORT TERM GOAL #3   Title Pt will decrease pain in RUE to 3/10 or less in order to sleep for 4 to 5+ consecutive hours without waking due to pain 75% of data opporutnieis.    Time 6    Period Weeks    Status Achieved    Target Date 10/17/21      OT SHORT TERM GOAL #4   Title Patient will improve R UE P/ROM to [facility] in order to improve mobility for functional reaching tasks that are above shoulder height.    Time 6    Period Weeks    Status Achieved    Target Date 03/06/22               OT Long Term Goals - 10/24/21 1320       OT LONG TERM GOAL #1   Title Patient will report using his R UE for ADL tasks including active ROM with 2/10 pain or less 75% of data opportunities.    Baseline 2/6: reports 2/10 or less 50% of the time.    Time 12    Period Weeks    Status Partially Met    Target Date 11/28/21      OT LONG TERM GOAL #2   Title Pt will increase A/ROM of RUE to [facility] to improve ability to reach overhead and behind back during dressing and bathing tasks.    Time 12    Period Weeks    Status Achieved    Target Date 11/28/21      OT LONG TERM GOAL #3   Title Pt will increase strength in RUE to 4+/5 to improve ability to perform lifting tasks required for work and daily activities.    Time 12     Period Weeks    Status Achieved    Target Date 11/28/20  Plan - 10/25/21 1629     Clinical Impression Statement A: Reassessment completed this date. Patient is able to achieve full or close to full A/ROM during this session. Shoulder strength is functional for basic ADL tasks. He reports occassional popping sensation with certain movements/activties. Pt has not attempted any lifting with significant weight yet. Discussed that he is able to progress as tolerated. If anything he does hurts; stop activity until no pain is felt. All short term goals have been met. 2/3 long term goals have been met with 1 partially met. HEP was reviewed. Added shoulder stretches. All education was completed. Discharge is recommended.    Body Structure / Function / Physical Skills ADL;Endurance;Fascial restriction;ROM;Pain;Strength;IADL;UE functional use    Plan P: Discharge from OT services with HEP.    OT Home Exercise Plan 09/05/21: table slides flexion and abduction 1/11: AA/ROM supine. 1/16: AA/ROM standing; 1/27: shoulder A/ROM; 1/30: Green band scapular strengthening 2/6: Shoulder stretches    Consulted and Agree with Plan of Care Patient             Patient will benefit from skilled therapeutic intervention in order to improve the following deficits and impairments:   Body Structure / Function / Physical Skills: ADL, Endurance, Fascial restriction, ROM, Pain, Strength, IADL, UE functional use       Visit Diagnosis: Stiffness of right shoulder, not elsewhere classified  Acute pain of right shoulder  Other symptoms and signs involving the musculoskeletal system    Problem List Patient Active Problem List   Diagnosis Date Noted   Allergy to alpha-gal    Hypothyroidism 08/28/2013   Hyperlipidemia    Obesity    OCCUPATIONAL THERAPY DISCHARGE SUMMARY  Visits from Start of Care: 13  Current functional level related to goals / functional outcomes: See above    Remaining deficits: See above   Education / Equipment: See above   Patient agrees to discharge. Patient goals were met. Patient is being discharged due to meeting the stated rehab goals.Ailene Ravel, OTR/L,CBIS  [phone]  10/25/2021, 4:34 PM  [place] [address] [place], [place], [address] Phone: [phone]   Fax:  [phone]  Name: Leshaun Biebel MRN: [id number] Date of Birth: [birth year]

## 2021-10-28 ENCOUNTER — Encounter (HOSPITAL_COMMUNITY): Payer: BC Managed Care – PPO | Admitting: Occupational Therapy

## 2021-10-31 DIAGNOSIS — L814 Other melanin hyperpigmentation: Secondary | ICD-10-CM | POA: Diagnosis not present

## 2021-10-31 DIAGNOSIS — L918 Other hypertrophic disorders of the skin: Secondary | ICD-10-CM | POA: Diagnosis not present

## 2021-10-31 DIAGNOSIS — D225 Melanocytic nevi of trunk: Secondary | ICD-10-CM | POA: Diagnosis not present

## 2021-10-31 DIAGNOSIS — R208 Other disturbances of skin sensation: Secondary | ICD-10-CM | POA: Diagnosis not present

## 2021-10-31 DIAGNOSIS — L538 Other specified erythematous conditions: Secondary | ICD-10-CM | POA: Diagnosis not present

## 2021-10-31 DIAGNOSIS — L648 Other androgenic alopecia: Secondary | ICD-10-CM | POA: Diagnosis not present

## 2021-10-31 DIAGNOSIS — L570 Actinic keratosis: Secondary | ICD-10-CM | POA: Diagnosis not present

## 2021-10-31 DIAGNOSIS — L821 Other seborrheic keratosis: Secondary | ICD-10-CM | POA: Diagnosis not present

## 2021-11-01 ENCOUNTER — Encounter (HOSPITAL_COMMUNITY): Payer: BC Managed Care – PPO | Admitting: Occupational Therapy

## 2021-11-02 ENCOUNTER — Other Ambulatory Visit: Payer: Self-pay

## 2021-11-02 ENCOUNTER — Other Ambulatory Visit: Payer: Self-pay | Admitting: Family Medicine

## 2021-11-02 ENCOUNTER — Encounter (HOSPITAL_COMMUNITY): Payer: BC Managed Care – PPO | Admitting: Occupational Therapy

## 2021-11-02 MED ORDER — SOLIFENACIN SUCCINATE 10 MG PO TABS: 10.0000 mg | ORAL_TABLET | Freq: Every day | ORAL | 1 refills | Status: DC

## 2021-11-03 ENCOUNTER — Other Ambulatory Visit: Payer: Self-pay

## 2021-11-04 ENCOUNTER — Encounter (HOSPITAL_COMMUNITY): Payer: BC Managed Care – PPO

## 2021-11-07 ENCOUNTER — Encounter (HOSPITAL_COMMUNITY): Payer: BC Managed Care – PPO

## 2021-11-07 DIAGNOSIS — M19011 Primary osteoarthritis, right shoulder: Secondary | ICD-10-CM | POA: Diagnosis not present

## 2021-11-11 ENCOUNTER — Encounter (HOSPITAL_COMMUNITY): Payer: BC Managed Care – PPO

## 2021-11-14 ENCOUNTER — Encounter (HOSPITAL_COMMUNITY): Payer: BC Managed Care – PPO

## 2021-11-18 ENCOUNTER — Encounter (HOSPITAL_COMMUNITY): Payer: BC Managed Care – PPO | Admitting: Occupational Therapy

## 2021-12-26 DIAGNOSIS — M19011 Primary osteoarthritis, right shoulder: Secondary | ICD-10-CM | POA: Diagnosis not present

## 2022-02-15 DIAGNOSIS — Z961 Presence of intraocular lens: Secondary | ICD-10-CM | POA: Diagnosis not present

## 2022-02-15 DIAGNOSIS — Z947 Corneal transplant status: Secondary | ICD-10-CM | POA: Diagnosis not present

## 2022-04-23 ENCOUNTER — Other Ambulatory Visit: Payer: Self-pay | Admitting: Family Medicine

## 2022-04-28 ENCOUNTER — Encounter: Payer: Self-pay | Admitting: Family Medicine

## 2022-05-02 ENCOUNTER — Other Ambulatory Visit: Payer: Self-pay | Admitting: Family Medicine

## 2022-05-02 MED ORDER — SOLIFENACIN SUCCINATE 10 MG PO TABS
10.0000 mg | ORAL_TABLET | Freq: Every day | ORAL | 1 refills | Status: DC
Start: 2022-05-02 — End: 2022-07-03

## 2022-05-26 ENCOUNTER — Encounter: Payer: Self-pay | Admitting: Family Medicine

## 2022-06-01 ENCOUNTER — Other Ambulatory Visit: Payer: BC Managed Care – PPO

## 2022-06-01 DIAGNOSIS — E7800 Pure hypercholesterolemia, unspecified: Secondary | ICD-10-CM | POA: Diagnosis not present

## 2022-06-01 DIAGNOSIS — Z6841 Body Mass Index (BMI) 40.0 and over, adult: Secondary | ICD-10-CM | POA: Diagnosis not present

## 2022-06-01 DIAGNOSIS — E039 Hypothyroidism, unspecified: Secondary | ICD-10-CM | POA: Diagnosis not present

## 2022-06-02 LAB — COMPREHENSIVE METABOLIC PANEL
AG Ratio: 2 (calc) (ref 1.0–2.5)
ALT: 19 U/L (ref 9–46)
AST: 20 U/L (ref 10–35)
Albumin: 4.6 g/dL (ref 3.6–5.1)
Alkaline phosphatase (APISO): 64 U/L (ref 35–144)
BUN: 14 mg/dL (ref 7–25)
CO2: 26 mmol/L (ref 20–32)
Calcium: 9.7 mg/dL (ref 8.6–10.3)
Chloride: 103 mmol/L (ref 98–110)
Creat: 0.96 mg/dL (ref 0.70–1.35)
Globulin: 2.3 g/dL (calc) (ref 1.9–3.7)
Glucose, Bld: 104 mg/dL — ABNORMAL HIGH (ref 65–99)
Potassium: 5.2 mmol/L (ref 3.5–5.3)
Sodium: 139 mmol/L (ref 135–146)
Total Bilirubin: 0.4 mg/dL (ref 0.2–1.2)
Total Protein: 6.9 g/dL (ref 6.1–8.1)

## 2022-06-02 LAB — LIPID PANEL
Cholesterol: 155 mg/dL (ref <200)
HDL: 49 mg/dL (ref >=40)
LDL Cholesterol (Calc): 87 mg/dL (calc)
Non-HDL Cholesterol (Calc): 106 mg/dL (calc) (ref <130)
Total CHOL/HDL Ratio: 3.2 (calc) (ref <5.0)
Triglycerides: 98 mg/dL (ref <150)

## 2022-06-02 LAB — TSH: TSH: 0.31 mIU/L — ABNORMAL LOW (ref 0.40–4.50)

## 2022-06-02 LAB — EXTRA LAV TOP TUBE

## 2022-06-05 ENCOUNTER — Other Ambulatory Visit: Payer: BC Managed Care – PPO

## 2022-06-05 DIAGNOSIS — Z86010 Personal history of colonic polyps: Secondary | ICD-10-CM | POA: Diagnosis not present

## 2022-06-05 DIAGNOSIS — R152 Fecal urgency: Secondary | ICD-10-CM | POA: Diagnosis not present

## 2022-06-05 DIAGNOSIS — R194 Change in bowel habit: Secondary | ICD-10-CM | POA: Diagnosis not present

## 2022-06-09 ENCOUNTER — Ambulatory Visit (INDEPENDENT_AMBULATORY_CARE_PROVIDER_SITE_OTHER): Payer: BC Managed Care – PPO | Admitting: Family Medicine

## 2022-06-09 ENCOUNTER — Encounter: Payer: Self-pay | Admitting: Family Medicine

## 2022-06-09 VITALS — BP 130/76 | HR 62 | Temp 98.5°F | Ht 70.0 in | Wt 268.4 lb

## 2022-06-09 DIAGNOSIS — E7800 Pure hypercholesterolemia, unspecified: Secondary | ICD-10-CM | POA: Diagnosis not present

## 2022-06-09 DIAGNOSIS — E039 Hypothyroidism, unspecified: Secondary | ICD-10-CM

## 2022-06-09 DIAGNOSIS — Z860100 Personal history of colon polyps, unspecified: Secondary | ICD-10-CM | POA: Insufficient documentation

## 2022-06-09 DIAGNOSIS — R152 Fecal urgency: Secondary | ICD-10-CM | POA: Insufficient documentation

## 2022-06-09 DIAGNOSIS — Z125 Encounter for screening for malignant neoplasm of prostate: Secondary | ICD-10-CM | POA: Diagnosis not present

## 2022-06-09 DIAGNOSIS — R194 Change in bowel habit: Secondary | ICD-10-CM | POA: Insufficient documentation

## 2022-06-09 DIAGNOSIS — Z0000 Encounter for general adult medical examination without abnormal findings: Secondary | ICD-10-CM

## 2022-06-09 MED ORDER — ZOLPIDEM TARTRATE 10 MG PO TABS: 10.0000 mg | ORAL_TABLET | Freq: Every evening | ORAL | 0 refills | Status: DC | PRN

## 2022-06-09 MED ORDER — WEGOVY 0.5 MG/0.5ML ~~LOC~~ SOAJ
0.5000 mg | SUBCUTANEOUS | 3 refills | Status: DC
Start: 2022-06-09 — End: 2022-06-13

## 2022-06-09 NOTE — Progress Notes (Signed)
Subjective:    Patient ID: Juan Stone, male    DOB: [birth year], 61 y.o.   MRN: [id number]  HPI Patient is a 61 year old white male here today for complete physical exam.  Patient had his colonoscopy in 2017.  His gastroenterologist recommended repeat colonoscopy in 7 years so he will be due next year.  He is overdue for PSA.  His TSH was recently supratherapeutic at 0.31 however the patient has a tendency to have alternating TSH is.  In other words we usually want to "chasing our tail" we adjust the medicine for small variations.  Therefore we both agree just to monitor this and recheck it again in 6 months given the small variation.  His blood sugar was slightly elevated at 104.  His flu shot, COVID-vaccine, Shingrix are all up-to-date.  His tetanus shot is not due again until 2027. Lab on 06/01/2022  Component Date Value Ref Range Status   TSH 06/01/2022 0.31 (L)  0.40 - 4.50 mIU/L Final   Glucose, Bld 06/01/2022 104 (H)  65 - 99 mg/dL Final   Comment: .            Fasting reference interval . For someone without known diabetes, a glucose value between 100 and 125 mg/dL is consistent with prediabetes and should be confirmed with a follow-up test. .    BUN 06/01/2022 14  7 - 25 mg/dL Final   Creat 06/01/2022 0.96  0.70 - 1.35 mg/dL Final   BUN/Creatinine Ratio 06/01/2022 SEE NOTE:  6 - 22 (calc) Final   Comment:    Not Reported: BUN and Creatinine are within    reference range. .    Sodium 06/01/2022 139  135 - 146 mmol/L Final   Potassium 06/01/2022 5.2  3.5 - 5.3 mmol/L Final   Chloride 06/01/2022 103  98 - 110 mmol/L Final   CO2 06/01/2022 26  20 - 32 mmol/L Final   Calcium 06/01/2022 9.7  8.6 - 10.3 mg/dL Final   Total Protein 06/01/2022 6.9  6.1 - 8.1 g/dL Final   Albumin 06/01/2022 4.6  3.6 - 5.1 g/dL Final   Globulin 06/01/2022 2.3  1.9 - 3.7 g/dL (calc) Final   AG Ratio 06/01/2022 2.0  1.0 - 2.5 (calc) Final   Total Bilirubin 06/01/2022 0.4  0.2 - 1.2 mg/dL Final    Alkaline phosphatase (APISO) 06/01/2022 64  35 - 144 U/L Final   AST 06/01/2022 20  10 - 35 U/L Final   ALT 06/01/2022 19  9 - 46 U/L Final   Cholesterol 06/01/2022 155  <200 mg/dL Final   HDL 06/01/2022 49  > OR = 40 mg/dL Final   Triglycerides 06/01/2022 98  <150 mg/dL Final   LDL Cholesterol (Calc) 06/01/2022 87  mg/dL (calc) Final   Comment: Reference range: <100 . Desirable range <100 mg/dL for primary prevention;   <70 mg/dL for patients with CHD or diabetic patients  with > or = 2 CHD risk factors. Marland Kitchen LDL-C is now calculated using the Martin-Hopkins  calculation, which is a validated novel method providing  better accuracy than the Friedewald equation in the  estimation of LDL-C.  Cresenciano Genre et al. Annamaria Helling. 3825;053(97): 2061-2068  (http://education.[facility].com/faq/FAQ164)    Total CHOL/HDL Ratio 06/01/2022 3.2  <5.0 (calc) Final   Non-HDL Cholesterol (Calc) 06/01/2022 106  <130 mg/dL (calc) Final   Comment: For patients with diabetes plus 1 major ASCVD risk  factor, treating to a non-HDL-C goal of <100 mg/dL  (LDL-C of <70  mg/dL) is considered a therapeutic  option.    EXTRA LAVENDER-TOP TUBE 06/01/2022    Final   Comment: We received an extra specimen with no test requested. If any test is desired for this specimen please call client services and advise.     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 Coralyn Pear   CORNEAL TRANSPLANT     Fuch's dystorphy (bilateral)   HERNIA REPAIR N/A    [employer]reesia 05/25/2020   Current Outpatient Medications on File Prior to Visit  Medication Sig Dispense Refill   clotrimazole-betamethasone (LOTRISONE) cream APPLY TO AFFECTED AREA TWICE A DAY 30 g 0   fluorometholone (FML)  0.1 % ophthalmic suspension 1 drop every 4 (four) hours.     fluticasone (FLONASE) 50 MCG/ACT nasal spray Place 2 sprays into both nostrils daily as needed for allergies or rhinitis. 16 g 11   hydrocortisone-pramoxine (ANALPRAM HC) 2.5-1 % rectal cream Place 1 application rectally 3 (three) times daily. Please dispense (2) tubes 60 g 0   levothyroxine (SYNTHROID) 200 MCG tablet Take 1 tablet (200 mcg total) by mouth daily before breakfast. 90 tablet 3   minoxidil (LONITEN) 2.5 MG tablet Take 2.5 mg by mouth 2 (two) times daily.     mometasone (ELOCON) 0.1 % cream Apply 1 application topically daily. 45 g 0   Multiple Vitamins tablet Take 1 tablet by mouth.     nystatin (MYCOSTATIN/NYSTOP) powder SMARTSIG:Sparingly Topical 3-4 Times Daily     oxyCODONE (OXY IR/ROXICODONE) 5 MG immediate release tablet Take 5 mg by mouth every 4 (four) hours as needed.     simvastatin (ZOCOR) 20 MG tablet TAKE 1 TABLET AT BEDTIME 90 tablet 3   solifenacin (VESICARE) 10 MG tablet Take 1 tablet (10 mg total) by mouth daily. 180 tablet 1   tizanidine (ZANAFLEX) 2 MG capsule Take 2 mg by mouth every 8 (eight) hours.     traZODone (DESYREL) 50 MG tablet TAKE ONE-HALF (1/2) TO ONE TABLET AT BEDTIME AS NEEDED FOR SLEEP 90 tablet 3   No current facility-administered medications on file prior to visit.   Allergies  Allergen Reactions   Other Diarrhea, Hives, Itching and Rash   Social History   Socioeconomic History   Marital status: Married    Spouse name: Not on file   Number of children: Not on file   Years of education: Not on file   Highest education level: Not on file  Occupational History   Not on file  Tobacco Use   Smoking status: Never    Passive exposure: Past   Smokeless tobacco: Never  Substance and Sexual Activity   Alcohol use: No    Comment: Ocassional   Drug use: No   Sexual activity: Not on file  Other Topics Concern   Not on file  Social History Narrative   Not on file   Social  Determinants of Health   Financial Resource Strain: Not on file  Food Insecurity: Not on file  Transportation Needs: Not on file  Physical Activity: Not on file  Stress: Not on file  Social Connections: Not on file  Intimate Partner Violence: Not on file  Review of Systems  All other systems reviewed and are negative.      Objective:   Physical Exam Vitals reviewed.  Constitutional:      General: He is not in acute distress.    Appearance: He is well-developed. He is not diaphoretic.  HENT:     Head: Normocephalic and atraumatic.     Right Ear: Tympanic membrane, ear canal and external ear normal.     Left Ear: Tympanic membrane, ear canal and external ear normal.     Nose: Nose normal.     Mouth/Throat:     Pharynx: No oropharyngeal exudate.  Eyes:     General: No scleral icterus.       Right eye: No discharge.        Left eye: No discharge.     Conjunctiva/sclera: Conjunctivae normal.     Pupils: Pupils are equal, round, and reactive to light.  Neck:     Thyroid: No thyromegaly.     Vascular: No JVD.     Trachea: No tracheal deviation.  Cardiovascular:     Rate and Rhythm: Normal rate and regular rhythm.     Heart sounds: Normal heart sounds. No murmur heard.    No friction rub. No gallop.  Pulmonary:     Effort: Pulmonary effort is normal. No respiratory distress.     Breath sounds: Normal breath sounds. No stridor. No wheezing or rales.  Abdominal:     General: Bowel sounds are normal. There is no distension.     Palpations: Abdomen is soft. There is no mass.     Tenderness: There is no abdominal tenderness. There is no guarding or rebound.  Musculoskeletal:     Cervical back: Normal range of motion and neck supple.  Lymphadenopathy:     Cervical: No cervical adenopathy.  Skin:    General: Skin is warm.     Coloration: Skin is not pale.     Findings: No erythema or rash.  Neurological:     Mental Status: He is alert and oriented to person, place,  and time.     Cranial Nerves: No cranial nerve deficit.     Motor: No abnormal muscle tone.     Coordination: Coordination normal.     Deep Tendon Reflexes: Reflexes normal.  Psychiatric:        Behavior: Behavior normal.        Thought Content: Thought content normal.        Judgment: Judgment normal.   Patient has large varicose veins particularly in his right leg along his medial right thigh and his medial anterior right shin.  These are asymptomatic and do not hurt him.  He has excellent peripheral pulses.        Assessment & Plan:  Prostate cancer screening - Plan: PSA  General medical exam  Hypothyroidism, unspecified type  Pure hypercholesterolemia Patient's blood pressure today is excellent.  However his BMI is 38.5.  He has hyperlipidemia and is on simvastatin.  His blood sugars are slightly elevated at 104.  He also has obstructive sleep apnea.  Therefore the patient has several weight and associated health problems with his elevated BMI.  Therefore if his insurance covers it, I believe he would be an excellent candidate for [facility].  Begin Wegovy 0.5 mg subcu weekly and uptitrate monthly as tolerated.  Recheck labs in 6 months.  Encouraged a low carbohydrate diet.  Discussed what carbohydrates are.   immunizations are up-to-date.  Check a PSA today

## 2022-06-10 LAB — PSA: PSA: 0.67 ng/mL (ref <=4.00)

## 2022-06-12 ENCOUNTER — Other Ambulatory Visit: Payer: Self-pay | Admitting: Family Medicine

## 2022-06-13 ENCOUNTER — Other Ambulatory Visit: Payer: Self-pay | Admitting: Family Medicine

## 2022-06-13 MED ORDER — WEGOVY 0.5 MG/0.5ML ~~LOC~~ SOAJ: 0.5000 mg | SUBCUTANEOUS | 3 refills | Status: DC

## 2022-06-13 NOTE — Telephone Encounter (Signed)
Newer rx at different pharm per pt request. Requested Prescriptions  Pending Prescriptions Disp Refills  . solifenacin (VESICARE) 10 MG tablet [Pharmacy Med Name: SOLIFENACIN SUCCINATE TABS 10MG ] 90 tablet 3    Sig: TAKE 1 TABLET DAILY     Urology:  Bladder Agents 2 Failed - 06/12/2022  9:20 PM      Failed - Valid encounter within last 12 months    Recent Outpatient Visits          10 months ago Upper respiratory tract infection, unspecified type   [place] Eulogio Bear, NP   1 year ago Urgency of urination   [place] Pickard, Cammie Mcgee, MD   2 years ago General medical exam   [place] Susy Frizzle, MD   3 years ago General medical exam   [place] Susy Frizzle, MD   4 years ago 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   [place] Pickard, Cammie Mcgee, MD             Passed - Cr in normal range and within 360 days    Creat  Date Value Ref Range Status  06/01/2022 0.96 0.70 - 1.35 mg/dL Final         Passed - ALT in normal range and within 360 days    ALT  Date Value Ref Range Status  06/01/2022 19 9 - 46 U/L Final         Passed - AST in normal range and within 360 days    AST  Date Value Ref Range Status  06/01/2022 20 10 - 35 U/L Final

## 2022-06-19 ENCOUNTER — Telehealth: Payer: Self-pay

## 2022-06-19 NOTE — Telephone Encounter (Signed)
PA-Wegovy send to plan 06/19/22

## 2022-06-30 ENCOUNTER — Telehealth: Payer: BC Managed Care – PPO | Admitting: Family Medicine

## 2022-06-30 DIAGNOSIS — J40 Bronchitis, not specified as acute or chronic: Secondary | ICD-10-CM | POA: Diagnosis not present

## 2022-06-30 DIAGNOSIS — U071 COVID-19: Secondary | ICD-10-CM

## 2022-06-30 MED ORDER — BENZONATATE 200 MG PO CAPS: 200.0000 mg | ORAL_CAPSULE | Freq: Two times a day (BID) | ORAL | 0 refills | Status: DC | PRN

## 2022-06-30 MED ORDER — AZITHROMYCIN 250 MG PO TABS: ORAL_TABLET | ORAL | 0 refills | Status: AC

## 2022-06-30 NOTE — Telephone Encounter (Signed)
PA was denied. However it will be covered after pt tried 87months of behavioral modification and dietary restriction.

## 2022-06-30 NOTE — Progress Notes (Signed)
Virtual Visit Consent   Juan Stone, you are scheduled for a virtual visit with a Juan Stone provider today. Just as with appointments in the office, your consent must be obtained to participate. Your consent will be active for this visit and any virtual visit you may have with one of our providers in the next 365 days. If you have a MyChart account, a copy of this consent can be sent to you electronically.  As this is a virtual visit, video technology does not allow for your provider to perform a traditional examination. This may limit your provider's ability to fully assess your condition. If your provider identifies any concerns that need to be evaluated in person or the need to arrange testing (such as labs, EKG, etc.), we will make arrangements to do so. Although advances in technology are sophisticated, we cannot ensure that it will always work on either your end or our end. If the connection with a video visit is poor, the visit may have to be switched to a telephone visit. With either a video or telephone visit, we are not always able to ensure that we have a secure connection.  By engaging in this virtual visit, you consent to the provision of healthcare and authorize for your insurance to be billed (if applicable) for the services provided during this visit. Depending on your insurance coverage, you may receive a charge related to this service.  I need to obtain your verbal consent now. Are you willing to proceed with your visit today? Toron Bowring has provided verbal consent on 06/30/2022 for a virtual visit (video or telephone). Dellia Nims, FNP  Date: 06/30/2022 3:35 PM  Virtual Visit via Video Note   I, Dellia Nims, connected with  Juan Stone  ([id number], 1961-05-16) on 06/30/22 at  2:15 PM EDT by a video-enabled telemedicine application and verified that I am speaking with the correct person using two identifiers.  Location: Patient: Virtual Visit Location Patient:  Home Provider: Virtual Visit Location Provider: Home Office   I discussed the limitations of evaluation and management by telemedicine and the availability of in person appointments. The patient expressed understanding and agreed to proceed.    History of Present Illness: Juan Stone is a 61 y.o. who identifies as a male who was assigned male at birth, and is being seen today for positive covid test today however sx started October 2 after hiking [place] a [place]. Temp was up to 101, head and chest congestion, no wheezing or sob, green mucus, HA, loss of taste and smell. Sx persist since he came home so he tested and was pos today.   HPI: HPI  Problems:  Patient Active Problem List   Diagnosis Date Noted   Change in bowel habit 06/09/2022   Fecal urgency 06/09/2022   Personal history of colonic polyps 06/09/2022   Allergy to alpha-gal    Hypothyroidism 08/28/2013   Hyperlipidemia    Obesity     Allergies:  Allergies  Allergen Reactions   Other Diarrhea, Hives, Itching and Rash   Medications:  Current Outpatient Medications:    azithromycin (ZITHROMAX) 250 MG tablet, Take 2 tablets on day 1, then 1 tablet daily on days 2 through 5, Disp: 6 tablet, Rfl: 0   benzonatate (TESSALON) 200 MG capsule, Take 1 capsule (200 mg total) by mouth 2 (two) times daily as needed for cough., Disp: 20 capsule, Rfl: 0   clotrimazole-betamethasone (LOTRISONE) cream, APPLY TO AFFECTED AREA TWICE A DAY, Disp: 30 g,  Rfl: 0   fluorometholone (FML) 0.1 % ophthalmic suspension, 1 drop every 4 (four) hours., Disp: , Rfl:    fluticasone (FLONASE) 50 MCG/ACT nasal spray, Place 2 sprays into both nostrils daily as needed for allergies or rhinitis., Disp: 16 g, Rfl: 11   hydrocortisone-pramoxine (ANALPRAM HC) 2.5-1 % rectal cream, Place 1 application rectally 3 (three) times daily. Please dispense (2) tubes, Disp: 60 g, Rfl: 0   levothyroxine (SYNTHROID) 200 MCG tablet, Take 1 tablet (200 mcg total) by  mouth daily before breakfast., Disp: 90 tablet, Rfl: 3   minoxidil (LONITEN) 2.5 MG tablet, Take 2.5 mg by mouth 2 (two) times daily., Disp: , Rfl:    mometasone (ELOCON) 0.1 % cream, Apply 1 application topically daily., Disp: 45 g, Rfl: 0   Multiple Vitamins tablet, Take 1 tablet by mouth., Disp: , Rfl:    nystatin (MYCOSTATIN/NYSTOP) powder, SMARTSIG:Sparingly Topical 3-4 Times Daily, Disp: , Rfl:    oxyCODONE (OXY IR/ROXICODONE) 5 MG immediate release tablet, Take 5 mg by mouth every 4 (four) hours as needed., Disp: , Rfl:    Semaglutide-Weight Management (WEGOVY) 0.5 MG/0.5ML SOAJ, Inject 0.5 mg into the skin once a week., Disp: 2 mL, Rfl: 3   simvastatin (ZOCOR) 20 MG tablet, TAKE 1 TABLET AT BEDTIME, Disp: 90 tablet, Rfl: 3   solifenacin (VESICARE) 10 MG tablet, Take 1 tablet (10 mg total) by mouth daily., Disp: 180 tablet, Rfl: 1   tizanidine (ZANAFLEX) 2 MG capsule, Take 2 mg by mouth every 8 (eight) hours., Disp: , Rfl:    traZODone (DESYREL) 50 MG tablet, TAKE ONE-HALF (1/2) TO ONE TABLET AT BEDTIME AS NEEDED FOR SLEEP, Disp: 90 tablet, Rfl: 3   zolpidem (AMBIEN) 10 MG tablet, Take 1 tablet (10 mg total) by mouth at bedtime as needed for sleep., Disp: 30 tablet, Rfl: 0  Observations/Objective: Patient is well-developed, well-nourished in no acute distress.  Resting comfortably  at home.  Head is normocephalic, atraumatic.  No labored breathing.  Speech is clear and coherent with logical content.  Patient is alert and oriented at baseline.    Assessment and Plan: 1. COVID  2. Bronchitis  Increase fluids, out of time frame for paxlovid, med use and side effects discussed, urgent care if sx persist or worsen.   Follow Up Instructions: I discussed the assessment and treatment plan with the patient. The patient was provided an opportunity to ask questions and all were answered. The patient agreed with the plan and demonstrated an understanding of the instructions.  A copy of  instructions were sent to the patient via MyChart unless otherwise noted below.     The patient was advised to call back or seek an in-person evaluation if the symptoms worsen or if the condition fails to improve as anticipated.  Time:  I spent 15 minutes with the patient via telehealth technology discussing the above problems/concerns.    Georgana Curio, FNP

## 2022-06-30 NOTE — Patient Instructions (Signed)

## 2022-07-01 ENCOUNTER — Encounter: Payer: Self-pay | Admitting: Family Medicine

## 2022-07-03 ENCOUNTER — Other Ambulatory Visit: Payer: Self-pay | Admitting: Family Medicine

## 2022-07-03 MED ORDER — SOLIFENACIN SUCCINATE 10 MG PO TABS: 10.0000 mg | ORAL_TABLET | Freq: Every day | ORAL | 3 refills | Status: DC

## 2022-07-06 ENCOUNTER — Telehealth: Payer: Self-pay

## 2022-07-06 NOTE — Telephone Encounter (Signed)
Pt's DJMEQA PA has been approved through cover my meds:  Juan Stone (Key: [facility])  This request has been approved.  Please note any additional information provided by [employer] at the bottom of your screen.  Pt advised through My Chart Message.

## 2022-08-24 ENCOUNTER — Encounter: Payer: Self-pay | Admitting: Family Medicine

## 2022-08-25 ENCOUNTER — Other Ambulatory Visit: Payer: Self-pay | Admitting: Family Medicine

## 2022-08-25 MED ORDER — FINASTERIDE 1 MG PO TABS: 1.0000 mg | ORAL_TABLET | Freq: Every day | ORAL | 11 refills | Status: DC

## 2022-09-05 DIAGNOSIS — G4733 Obstructive sleep apnea (adult) (pediatric): Secondary | ICD-10-CM | POA: Diagnosis not present

## 2022-09-05 DIAGNOSIS — E039 Hypothyroidism, unspecified: Secondary | ICD-10-CM | POA: Diagnosis not present

## 2022-09-05 DIAGNOSIS — R030 Elevated blood-pressure reading, without diagnosis of hypertension: Secondary | ICD-10-CM | POA: Diagnosis not present

## 2022-09-08 ENCOUNTER — Encounter: Payer: Self-pay | Admitting: Family Medicine

## 2022-09-13 ENCOUNTER — Encounter: Payer: Self-pay | Admitting: Family Medicine

## 2022-09-14 ENCOUNTER — Other Ambulatory Visit: Payer: BC Managed Care – PPO

## 2022-09-14 ENCOUNTER — Other Ambulatory Visit: Payer: Self-pay | Admitting: Family Medicine

## 2022-09-14 DIAGNOSIS — M2550 Pain in unspecified joint: Secondary | ICD-10-CM

## 2022-09-14 DIAGNOSIS — E039 Hypothyroidism, unspecified: Secondary | ICD-10-CM | POA: Diagnosis not present

## 2022-09-14 DIAGNOSIS — G4733 Obstructive sleep apnea (adult) (pediatric): Secondary | ICD-10-CM | POA: Diagnosis not present

## 2022-09-14 DIAGNOSIS — Z91018 Allergy to other foods: Secondary | ICD-10-CM

## 2022-09-14 MED ORDER — ZEPBOUND 5 MG/0.5ML ~~LOC~~ SOAJ: 5.0000 mg | SUBCUTANEOUS | 1 refills | Status: DC

## 2022-09-15 LAB — COMPREHENSIVE METABOLIC PANEL
AG Ratio: 1.6 (calc) (ref 1.0–2.5)
ALT: 20 U/L (ref 9–46)
AST: 20 U/L (ref 10–35)
Albumin: 4.2 g/dL (ref 3.6–5.1)
Alkaline phosphatase (APISO): 61 U/L (ref 35–144)
BUN: 13 mg/dL (ref 7–25)
CO2: 26 mmol/L (ref 20–32)
Calcium: 9.4 mg/dL (ref 8.6–10.3)
Chloride: 102 mmol/L (ref 98–110)
Creat: 1.05 mg/dL (ref 0.70–1.35)
Globulin: 2.7 g/dL (calc) (ref 1.9–3.7)
Glucose, Bld: 108 mg/dL — ABNORMAL HIGH (ref 65–99)
Potassium: 4.8 mmol/L (ref 3.5–5.3)
Sodium: 139 mmol/L (ref 135–146)
Total Bilirubin: 0.5 mg/dL (ref 0.2–1.2)
Total Protein: 6.9 g/dL (ref 6.1–8.1)

## 2022-09-15 LAB — TSH: TSH: 1.42 mIU/L (ref 0.40–4.50)

## 2022-10-08 ENCOUNTER — Encounter: Payer: Self-pay | Admitting: Family Medicine

## 2022-10-09 ENCOUNTER — Other Ambulatory Visit: Payer: Self-pay | Admitting: Family Medicine

## 2022-10-09 ENCOUNTER — Other Ambulatory Visit: Payer: Self-pay

## 2022-10-09 MED ORDER — FINASTERIDE 1 MG PO TABS: 1.0000 mg | ORAL_TABLET | Freq: Every day | ORAL | 3 refills | Status: DC

## 2022-10-12 ENCOUNTER — Other Ambulatory Visit: Payer: Self-pay | Admitting: Family Medicine

## 2022-10-13 NOTE — Telephone Encounter (Signed)
Requested Prescriptions  Pending Prescriptions Disp Refills   levothyroxine (SYNTHROID) 200 MCG tablet [Pharmacy Med Name: L-THYROXINE (SYNTHROID) TABS 200MCG] 90 tablet 3    Sig: TAKE 1 TABLET DAILY BEFORE BREAKFAST     Endocrinology:  Hypothyroid Agents Failed - 10/12/2022  7:30 PM      Failed - Valid encounter within last 12 months    Recent Outpatient Visits           1 year ago Upper respiratory tract infection, unspecified type   [facility] Eulogio Bear, NP   1 year ago Urgency of urination   [place] [facility], Cammie Mcgee, MD   2 years ago General medical exam   [place] Susy Frizzle, MD   3 years ago General medical exam   [place] Susy Frizzle, MD   4 years ago 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   [place] [facility], Cammie Mcgee, MD              Passed - TSH in normal range and within 360 days    TSH  Date Value Ref Range Status  09/14/2022 1.42 0.40 - 4.50 mIU/L Final          simvastatin (ZOCOR) 20 MG tablet [Pharmacy Med Name: SIMVASTATIN TABS 20MG ] 90 tablet 2    Sig: TAKE 1 TABLET AT BEDTIME     Cardiovascular:  Antilipid - Statins Failed - 10/12/2022  7:30 PM      Failed - Valid encounter within last 12 months    Recent Outpatient Visits           1 year ago Upper respiratory tract infection, unspecified type   [facility] Eulogio Bear, NP   1 year ago Urgency of urination   [place] [facility], Cammie Mcgee, MD   2 years ago General medical exam   [place] Susy Frizzle, MD   3 years ago General medical exam   [place] Susy Frizzle, MD   4 years ago 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 Desirable range <100 mg/dL for primary prevention;   <70 mg/dL for patients with CHD or diabetic patients  with > or = 2 CHD risk factors. Marland Kitchen LDL-C is now calculated using the Martin-Hopkins  calculation, which is a validated novel method providing  better accuracy than the Friedewald equation in the  estimation of LDL-C.  Cresenciano Genre et al. Annamaria Helling. 6144;315(40): 2061-2068  (http://education.[facility].com/faq/FAQ164)    Direct LDL  Date Value Ref Range Status  03/07/2019 107 (H) <100 mg/dL Final    Comment:    Greatly elevated Triglycerides values (>1200 mg/dL) interfere with the dLDL assay. As no Triglycerides  testing was ordered, interpret results with caution. . Desirable range <100 mg/dL for primary prevention;   <70 mg/dL for patients with CHD or diabetic patients  with > or = 2 CHD risk factors. Marland Kitchen    HDL  Date Value  Ref Range Status  06/01/2022 49 > OR = 40 mg/dL Final   Triglycerides  Date Value Ref Range Status  06/01/2022 98 <150 mg/dL Final         Passed - Patient is not pregnant

## 2022-11-06 ENCOUNTER — Encounter: Payer: Self-pay | Admitting: Family Medicine

## 2022-11-06 ENCOUNTER — Other Ambulatory Visit: Payer: Self-pay

## 2022-11-06 DIAGNOSIS — Z6838 Body mass index (BMI) 38.0-38.9, adult: Secondary | ICD-10-CM

## 2022-11-06 MED ORDER — TIRZEPATIDE-WEIGHT MANAGEMENT 7.5 MG/0.5ML ~~LOC~~ SOAJ: 7.5000 mg | SUBCUTANEOUS | 0 refills | Status: DC

## 2022-12-03 ENCOUNTER — Other Ambulatory Visit: Payer: Self-pay | Admitting: Family Medicine

## 2022-12-07 ENCOUNTER — Encounter: Payer: Self-pay | Admitting: Family Medicine

## 2022-12-07 ENCOUNTER — Other Ambulatory Visit: Payer: Self-pay | Admitting: Family Medicine

## 2022-12-07 ENCOUNTER — Telehealth: Payer: Self-pay

## 2022-12-07 MED ORDER — ZEPBOUND 5 MG/0.5ML ~~LOC~~ SOAJ: 5.0000 mg | SUBCUTANEOUS | 3 refills | Status: DC

## 2022-12-07 NOTE — Telephone Encounter (Signed)
Pt called in stating that his regular pharmacy is out of this med ZEPBOUND 7.5 MG/0.5ML Pen [id number]. Pt states that he searched and found a 5mg  dosage amount available at the [facility] in [facility]. Pt would like for this med to be called in as they have a limited amount available. Please advise  Cb#: [phone]

## 2022-12-08 DIAGNOSIS — R030 Elevated blood-pressure reading, without diagnosis of hypertension: Secondary | ICD-10-CM | POA: Insufficient documentation

## 2022-12-08 DIAGNOSIS — G4733 Obstructive sleep apnea (adult) (pediatric): Secondary | ICD-10-CM | POA: Insufficient documentation

## 2022-12-25 ENCOUNTER — Encounter: Payer: Self-pay | Admitting: Family Medicine

## 2023-01-10 ENCOUNTER — Other Ambulatory Visit: Payer: Self-pay

## 2023-01-10 ENCOUNTER — Telehealth: Payer: Self-pay

## 2023-01-10 NOTE — Progress Notes (Unsigned)
zep

## 2023-01-10 NOTE — Telephone Encounter (Signed)
Prior Authorization appeal sent through [employer] for Zepbound 7.5 mg. Case Id# [id number]. Phone [phone].

## 2023-01-15 ENCOUNTER — Other Ambulatory Visit: Payer: Self-pay

## 2023-01-15 DIAGNOSIS — E7800 Pure hypercholesterolemia, unspecified: Secondary | ICD-10-CM

## 2023-01-15 DIAGNOSIS — E039 Hypothyroidism, unspecified: Secondary | ICD-10-CM

## 2023-01-15 MED ORDER — ZEPBOUND 7.5 MG/0.5ML ~~LOC~~ SOAJ: 7.5000 mg | SUBCUTANEOUS | 1 refills | Status: DC

## 2023-01-15 MED ORDER — ZEPBOUND 5 MG/0.5ML ~~LOC~~ SOAJ: 5.0000 mg | SUBCUTANEOUS | 3 refills | Status: DC

## 2023-02-07 ENCOUNTER — Encounter: Payer: Self-pay | Admitting: Family Medicine

## 2023-02-09 ENCOUNTER — Other Ambulatory Visit (INDEPENDENT_AMBULATORY_CARE_PROVIDER_SITE_OTHER): Payer: BC Managed Care – PPO

## 2023-02-09 DIAGNOSIS — E785 Hyperlipidemia, unspecified: Secondary | ICD-10-CM | POA: Diagnosis not present

## 2023-02-09 DIAGNOSIS — E039 Hypothyroidism, unspecified: Secondary | ICD-10-CM

## 2023-02-09 DIAGNOSIS — Z125 Encounter for screening for malignant neoplasm of prostate: Secondary | ICD-10-CM | POA: Diagnosis not present

## 2023-02-09 DIAGNOSIS — Z91018 Allergy to other foods: Secondary | ICD-10-CM

## 2023-02-09 DIAGNOSIS — Z6838 Body mass index (BMI) 38.0-38.9, adult: Secondary | ICD-10-CM | POA: Diagnosis not present

## 2023-02-09 DIAGNOSIS — E66812 Obesity, class 2: Secondary | ICD-10-CM

## 2023-02-09 DIAGNOSIS — E7800 Pure hypercholesterolemia, unspecified: Secondary | ICD-10-CM

## 2023-02-10 LAB — COMPLETE METABOLIC PANEL WITH GFR
AG Ratio: 1.9 (calc) (ref 1.0–2.5)
ALT: 19 U/L (ref 9–46)
AST: 18 U/L (ref 10–35)
Albumin: 4.2 g/dL (ref 3.6–5.1)
Alkaline phosphatase (APISO): 55 U/L (ref 35–144)
BUN: 11 mg/dL (ref 7–25)
CO2: 28 mmol/L (ref 20–32)
Calcium: 9.4 mg/dL (ref 8.6–10.3)
Chloride: 103 mmol/L (ref 98–110)
Creat: 1.01 mg/dL (ref 0.70–1.35)
Globulin: 2.2 g/dL (calc) (ref 1.9–3.7)
Glucose, Bld: 104 mg/dL — ABNORMAL HIGH (ref 65–99)
Potassium: 5 mmol/L (ref 3.5–5.3)
Sodium: 138 mmol/L (ref 135–146)
Total Bilirubin: 0.7 mg/dL (ref 0.2–1.2)
Total Protein: 6.4 g/dL (ref 6.1–8.1)
eGFR: 83 mL/min/{1.73_m2} (ref >=60)

## 2023-02-10 LAB — CBC WITH DIFFERENTIAL/PLATELET
Absolute Monocytes: 437 {cells}/uL (ref 200–950)
Basophils Absolute: 41 {cells}/uL (ref 0–200)
Basophils Relative: 0.9 %
Eosinophils Absolute: 129 {cells}/uL (ref 15–500)
Eosinophils Relative: 2.8 %
HCT: 44.2 % (ref 38.5–50.0)
Hemoglobin: 14.8 g/dL (ref 13.2–17.1)
Lymphs Abs: 911 {cells}/uL (ref 850–3900)
MCH: 31.9 pg (ref 27.0–33.0)
MCHC: 33.5 g/dL (ref 32.0–36.0)
MCV: 95.3 fL (ref 80.0–100.0)
MPV: 9.7 fL (ref 7.5–12.5)
Monocytes Relative: 9.5 %
Neutro Abs: 3082 {cells}/uL (ref 1500–7800)
Neutrophils Relative %: 67 %
Platelets: 240 10*3/uL (ref 140–400)
RBC: 4.64 Million/uL (ref 4.20–5.80)
RDW: 11.9 % (ref 11.0–15.0)
Total Lymphocyte: 19.8 %
WBC: 4.6 10*3/uL (ref 3.8–10.8)

## 2023-02-10 LAB — PSA: PSA: 0.26 ng/mL (ref <=4.00)

## 2023-02-10 LAB — LIPID PANEL
Cholesterol: 147 mg/dL (ref <200)
HDL: 49 mg/dL (ref >=40)
LDL Cholesterol (Calc): 82 mg/dL (calc)
Non-HDL Cholesterol (Calc): 98 mg/dL (calc) (ref <130)
Total CHOL/HDL Ratio: 3 (calc) (ref <5.0)
Triglycerides: 79 mg/dL (ref <150)

## 2023-02-10 LAB — TSH: TSH: 0.01 mIU/L — ABNORMAL LOW (ref 0.40–4.50)

## 2023-02-13 ENCOUNTER — Encounter: Payer: Self-pay | Admitting: Family Medicine

## 2023-02-13 LAB — ALPHA-GAL PANEL
Allergen, Pork, f26: 54.7 kU/L — ABNORMAL HIGH
Beef: 65.6 kU/L — ABNORMAL HIGH
Class: 4

## 2023-02-14 ENCOUNTER — Other Ambulatory Visit: Payer: Self-pay

## 2023-02-14 ENCOUNTER — Telehealth: Payer: Self-pay

## 2023-02-14 DIAGNOSIS — E039 Hypothyroidism, unspecified: Secondary | ICD-10-CM

## 2023-02-14 MED ORDER — LEVOTHYROXINE SODIUM 175 MCG PO TABS
175.0000 ug | ORAL_TABLET | Freq: Every day | ORAL | 1 refills | Status: DC
Start: 2023-02-14 — End: 2023-09-03

## 2023-02-14 NOTE — Telephone Encounter (Signed)
Pt sent My Chart message and verified Levothyroxine dose of 200 mcg. Thank you.

## 2023-02-19 NOTE — Progress Notes (Signed)
LAB VISIT

## 2023-02-23 LAB — ALPHA-GAL PANEL
Allergen, Mutton, f88: 45.8 kU/L — ABNORMAL HIGH
CLASS: 5
CLASS: 5
GALACTOSE-ALPHA-1,3-GALACTOSE IGE*: >100 kU/L — ABNORMAL HIGH (ref <0.10)

## 2023-02-23 LAB — INTERPRETATION:

## 2023-03-14 ENCOUNTER — Emergency Department (HOSPITAL_COMMUNITY)
Admission: EM | Admit: 2023-03-14 | Discharge: 2023-03-14 | Disposition: A | Discharge status: Home / Self Care | Payer: BC Managed Care – PPO | Attending: Emergency Medicine | Admitting: Emergency Medicine

## 2023-03-14 ENCOUNTER — Other Ambulatory Visit: Payer: Self-pay

## 2023-03-14 ENCOUNTER — Encounter (HOSPITAL_COMMUNITY): Payer: Self-pay

## 2023-03-14 DIAGNOSIS — R21 Rash and other nonspecific skin eruption: Secondary | ICD-10-CM | POA: Diagnosis not present

## 2023-03-14 DIAGNOSIS — T7840XA Allergy, unspecified, initial encounter: Secondary | ICD-10-CM | POA: Diagnosis not present

## 2023-03-14 MED ORDER — DEXAMETHASONE SODIUM PHOSPHATE 10 MG/ML IJ SOLN
10.0000 mg | Freq: Once | INTRAMUSCULAR | Status: AC
  Administered 2023-03-14: 10 mg via INTRAVENOUS
  Filled 2023-03-14: qty 1

## 2023-03-14 MED ORDER — SODIUM CHLORIDE 0.9 % IV BOLUS
500.0000 mL | Freq: Once | INTRAVENOUS | Status: AC
  Administered 2023-03-14: 500 mL via INTRAVENOUS

## 2023-03-14 MED ORDER — FAMOTIDINE IN NACL 20-0.9 MG/50ML-% IV SOLN
20.0000 mg | Freq: Once | INTRAVENOUS | Status: AC
  Administered 2023-03-14: 20 mg via INTRAVENOUS
  Filled 2023-03-14: qty 50

## 2023-03-14 MED ORDER — EPINEPHRINE 0.3 MG/0.3ML IJ SOAJ: 0.3000 mg | INTRAMUSCULAR | 0 refills | Status: DC | PRN

## 2023-03-14 NOTE — ED Provider Notes (Signed)
EMERGENCY DEPARTMENT AT [facility] Provider Note   CSN: [id number] Arrival date & time: 03/14/23  1749     History {Add pertinent medical, surgical, social history, OB history to HPI:1} Chief Complaint  Patient presents with   Allergic Reaction    Juan Stone is a 62 y.o. male.  He is here for evaluation of allergic reaction.  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  He accidentally ate pork sausage at lunchtime.  He began experiencing hives, nausea, dizziness.  He took 2 Benadryl without improvement.  Gave himself an old EpiPen around an hour ago.  Does not feel like symptoms have improved.  Does not feel short of breath or have difficulty speaking or swallowing.  The history is provided by the patient.  Allergic Reaction Presenting symptoms: itching and rash   Severity:  Moderate Prior allergic episodes:  Food/nut allergies Context: food   Relieved by:  Nothing Worsened by:  Nothing Ineffective treatments:  Epinephrine and antihistamines      Home Medications Prior to Admission medications   Medication Sig Start Date End Date Taking? Authorizing Provider  clotrimazole-betamethasone (LOTRISONE) cream APPLY TO AFFECTED AREA TWICE A DAY 07/13/16   Donita Brooks, MD  finasteride (PROPECIA) 1 MG tablet Take 1 tablet (1 mg total) by mouth daily. 10/09/22   Donita Brooks, MD  fluorometholone (FML) 0.1 % ophthalmic suspension 1 drop every 4 (four) hours.    [provider]  fluticasone (FLONASE) 50 MCG/ACT nasal spray Place 2 sprays into both nostrils daily as needed for allergies or rhinitis. 01/31/16   Donita Brooks, MD  hydrocortisone-pramoxine (ANALPRAM HC) 2.5-1 % rectal cream Place 1 application rectally 3 (three) times daily. Please dispense (2) tubes 03/31/19   Donita Brooks, MD  levothyroxine (SYNTHROID) 175 MCG tablet Take 1 tablet (175 mcg total) by mouth daily. 02/14/23   Donita Brooks, MD  minoxidil  (LONITEN) 2.5 MG tablet Take 2.5 mg by mouth 2 (two) times daily. 10/31/21   [provider]  mometasone (ELOCON) 0.1 % cream Apply 1 application topically daily. 03/14/19   Donita Brooks, MD  Multiple Vitamins tablet Take 1 tablet by mouth.    [provider]  simvastatin (ZOCOR) 20 MG tablet TAKE 1 TABLET AT BEDTIME 10/13/22   Donita Brooks, MD  solifenacin (VESICARE) 10 MG tablet Take 1 tablet (10 mg total) by mouth daily. 07/03/22   Donita Brooks, MD  tirzepatide (ZEPBOUND) 7.5 MG/0.5ML Pen Inject 7.5 mg into the skin once a week. 01/15/23   Donita Brooks, MD  traZODone (DESYREL) 50 MG tablet TAKE ONE-HALF (1/2) TO ONE TABLET AT BEDTIME AS NEEDED FOR SLEEP 04/24/22   Donita Brooks, MD      Allergies    Alpha-d-galactosidase and Other    Review of Systems   Review of Systems  Constitutional:  Negative for fever.  HENT:  Negative for sore throat.   Eyes:  Negative for visual disturbance.  Respiratory:  Negative for shortness of breath.   Cardiovascular:  Positive for palpitations. Negative for chest pain.  Gastrointestinal:  Positive for nausea. Negative for abdominal pain.  Genitourinary:  Negative for dysuria.  Skin:  Positive for itching and rash.  Neurological:  Positive for dizziness.    Physical Exam Updated Vital Signs There were no vitals taken for this visit. Physical Exam Vitals and nursing note reviewed.  Constitutional:      General: He is not in  acute distress.    Appearance: Normal appearance. He is well-developed.  HENT:     Head: Normocephalic and atraumatic.     Mouth/Throat:     Mouth: Mucous membranes are moist.     Pharynx: Oropharynx is clear.  Eyes:     Conjunctiva/sclera: Conjunctivae normal.  Cardiovascular:     Rate and Rhythm: Normal rate and regular rhythm.     Heart sounds: No murmur heard. Pulmonary:     Effort: Pulmonary effort is normal. No respiratory distress.     Breath sounds: Normal breath sounds.   Abdominal:     Palpations: Abdomen is soft.     Tenderness: There is no abdominal tenderness. There is no guarding or rebound.  Musculoskeletal:        General: No deformity. Normal range of motion.     Cervical back: Neck supple.  Skin:    General: Skin is warm and dry.     Capillary Refill: Capillary refill takes less than 2 seconds.  Neurological:     General: No focal deficit present.     Mental Status: He is alert and oriented to person, place, and time.     Cranial Nerves: No cranial nerve deficit.     Sensory: No sensory deficit.     Motor: No weakness.     ED Results / Procedures / Treatments   Labs (all labs ordered are listed, but only abnormal results are displayed) Labs Reviewed - No data to display  EKG None  Radiology No results found.  Procedures Procedures  {Document cardiac monitor, telemetry assessment procedure when appropriate:1}  Medications Ordered in ED Medications  dexamethasone (DECADRON) injection 10 mg (has no administration in time range)  famotidine (PEPCID) IVPB 20 mg premix (has no administration in time range)  sodium chloride 0.9 % bolus 500 mL (has no administration in time range)    ED Course/ Medical Decision Making/ A&P   {   Click here for ABCD2, HEART and other calculatorsREFRESH Note before signing :1}                          Medical Decision Making Risk Prescription drug management.   This patient complains of ***; this involves an extensive number of treatment Options and is a complaint that carries with it a high risk of complications and morbidity. The differential includes ***  I ordered, reviewed and interpreted labs, which included *** I ordered medication *** and reviewed PMP when indicated. I ordered imaging studies which included *** and I independently    visualized and interpreted imaging which showed *** Additional history obtained from *** Previous records obtained and reviewed *** I consulted *** and  discussed lab and imaging findings and discussed disposition.  Cardiac monitoring reviewed, *** Social determinants considered, *** Critical Interventions: ***  After the interventions stated above, I reevaluated the patient and found *** Admission and further testing considered, ***   {Document critical care time when appropriate:1} {Document review of labs and clinical decision tools ie heart score, Chads2Vasc2 etc:1}  {Document your independent review of radiology images, and any outside records:1} {Document your discussion with family members, caretakers, and with consultants:1} {Document social determinants of health affecting pt's care:1} {Document your decision making why or why not admission, treatments were needed:1} Final Clinical Impression(s) / ED Diagnoses Final diagnoses:  None    Rx / DC Orders ED Discharge Orders     None

## 2023-03-14 NOTE — Discharge Instructions (Addendum)
You were seen in the emergency department for possible allergic reaction.  Please continue to use Benadryl 1 to 2 tablets every 6 hours as needed for symptoms.  Stay well-hydrated.  Follow-up with your primary care doctor.  Return if any worsening or concerning symptoms

## 2023-03-14 NOTE — ED Triage Notes (Signed)
Pt presents to ED from home C/O allergic reaction. 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 Ate something around 1100 that possibly had pork in it. Reports hives, nausea, lightheadedness beginning around 1600. Took zofran, 2 benadryl around 1600. Self administered Epipen around 1730.

## 2023-03-27 ENCOUNTER — Ambulatory Visit (INDEPENDENT_AMBULATORY_CARE_PROVIDER_SITE_OTHER): Payer: BC Managed Care – PPO | Admitting: Family Medicine

## 2023-03-27 ENCOUNTER — Encounter: Payer: Self-pay | Admitting: Family Medicine

## 2023-03-27 VITALS — BP 120/66 | HR 67 | Temp 98.8°F | Ht 70.0 in | Wt 241.2 lb

## 2023-03-27 DIAGNOSIS — E7800 Pure hypercholesterolemia, unspecified: Secondary | ICD-10-CM

## 2023-03-27 DIAGNOSIS — E039 Hypothyroidism, unspecified: Secondary | ICD-10-CM

## 2023-03-27 LAB — CBC WITH DIFFERENTIAL/PLATELET
Basophils Absolute: 60 cells/uL (ref 0–200)
Basophils Relative: 1.3 %
Eosinophils Absolute: 271 cells/uL (ref 15–500)
Eosinophils Relative: 5.9 %
Hemoglobin: 14.7 g/dL (ref 13.2–17.1)
Lymphs Abs: 952 cells/uL (ref 850–3900)
MCHC: 33.6 g/dL (ref 32.0–36.0)
MPV: 9.8 fL (ref 7.5–12.5)
Neutro Abs: 2847 cells/uL (ref 1500–7800)
Neutrophils Relative %: 61.9 %
Total Lymphocyte: 20.7 %

## 2023-03-27 MED ORDER — ZEPBOUND 10 MG/0.5ML ~~LOC~~ SOAJ: 10.0000 mg | SUBCUTANEOUS | 3 refills | Status: DC

## 2023-03-27 MED ORDER — MINOXIDIL 2.5 MG PO TABS: 2.5000 mg | ORAL_TABLET | Freq: Two times a day (BID) | ORAL | 3 refills | Status: DC

## 2023-03-27 NOTE — Progress Notes (Signed)
Subjective:    Patient ID: Juan Stone, male    DOB: 07-01-1961, 62 y.o.   MRN: [id number]  HPI  Patient recently went to the emergency room after having allergic reaction to pork sausage.  He has a known history of allergy to alpha gal.  He now has an updated EpiPen.  We discussed storage and when to use it.  He is here today to recheck his thyroid.  He has been taking 175 mcg of levothyroxine for the last 6 weeks.  He is also still taking Mounjaro and is taking 7.5 mg subcu weekly.  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 Coralyn Pear   CORNEAL TRANSPLANT     Fuch's dystorphy (bilateral)   HERNIA REPAIR N/A    [employer] 05/25/2020   Current Outpatient Medications on File Prior to Visit  Medication Sig Dispense Refill   clotrimazole-betamethasone (LOTRISONE) cream APPLY TO AFFECTED AREA TWICE A DAY 30 g 0   EPINEPHrine 0.3 mg/0.3 mL IJ SOAJ injection Inject 0.3 mg into the muscle as needed for anaphylaxis. 1 each 0   finasteride (PROPECIA) 1 MG tablet Take 1 tablet (1 mg total) by mouth daily. 90 tablet 3   fluorometholone (FML) 0.1 % ophthalmic suspension 1 drop every 4 (four) hours.     fluticasone (FLONASE) 50 MCG/ACT nasal spray Place 2 sprays into both nostrils daily as needed for allergies or rhinitis. 16 g 11   hydrocortisone-pramoxine (ANALPRAM HC) 2.5-1 % rectal cream Place 1 application rectally 3 (three) times daily. Please dispense (2) tubes 60 g 0   levothyroxine (SYNTHROID) 175 MCG tablet Take 1 tablet (175 mcg total) by mouth daily. 90 tablet 1   minoxidil (LONITEN) 2.5 MG tablet Take 2.5 mg by mouth 2 (two) times daily.     mometasone (ELOCON) 0.1 % cream Apply 1 application  topically daily. 45 g 0   Multiple Vitamins tablet Take 1 tablet by mouth.     simvastatin (ZOCOR) 20 MG tablet TAKE 1 TABLET AT BEDTIME 90 tablet 2   solifenacin (VESICARE) 10 MG tablet Take 1 tablet (10 mg total) by mouth daily. 90 tablet 3   tirzepatide (ZEPBOUND) 7.5 MG/0.5ML Pen Inject 7.5 mg into the skin once a week. 2 mL 1   traZODone (DESYREL) 50 MG tablet TAKE ONE-HALF (1/2) TO ONE TABLET AT BEDTIME AS NEEDED FOR SLEEP 90 tablet 3   No current facility-administered medications on file prior to visit.   Allergies  Allergen Reactions   Alpha-D-Galactosidase Nausea And Vomiting and Hives   Other Diarrhea, Hives, Itching and Rash   Social History   Socioeconomic History   Marital status: Married    Spouse name: Not on file   Number of children: Not on file   Years of education: Not on file   Highest education level: Bachelor's degree (e.g., BA, AB, BS)  Occupational History   Not on file  Tobacco Use   Smoking status: Never    Passive exposure: Past   Smokeless tobacco: Never  Substance and Sexual Activity   Alcohol use: No    Comment: Ocassional   Drug use: No  Sexual activity: Not on file  Other Topics Concern   Not on file  Social History Narrative   Not on file   Social Determinants of Health   Financial Resource Strain: Low Risk  (02/08/2023)   Overall Financial Resource Strain (CARDIA)    Difficulty of Paying Living Expenses: Not hard at all  Food Insecurity: No Food Insecurity (02/08/2023)   Hunger Vital Sign    Worried About Running Out of Food in the Last Year: Never true    Ran Out of Food in the Last Year: Never true  Transportation Needs: No Transportation Needs (02/08/2023)   PRAPARE - Administrator, Civil Service (Medical): No    Lack of Transportation (Non-Medical): No  Physical Activity: Insufficiently Active (02/08/2023)   Exercise Vital Sign    Days of Exercise per Week: 3 days    Minutes of Exercise per Session: 20 min  Stress:  No Stress Concern Present (02/08/2023)   [employer] of Occupational Health - Occupational Stress Questionnaire    Feeling of Stress : Not at all  Social Connections: Unknown (02/08/2023)   Social Connection and Isolation Panel [NHANES]    Frequency of Communication with Friends and Family: Patient declined    Frequency of Social Gatherings with Friends and Family: Patient declined    Attends Religious Services: Patient declined    Database administrator or Organizations: Patient declined    Attends Banker Meetings: Not on file    Marital Status: Patient declined  Intimate Partner Violence: Not on file      Review of Systems  All other systems reviewed and are negative.      Objective:   Physical Exam Vitals reviewed.  Constitutional:      General: He is not in acute distress.    Appearance: He is well-developed. He is not diaphoretic.  HENT:     Head: Normocephalic and atraumatic.     Right Ear: Tympanic membrane, ear canal and external ear normal.     Left Ear: Tympanic membrane, ear canal and external ear normal.     Nose: Nose normal.     Mouth/Throat:     Pharynx: No oropharyngeal exudate.  Eyes:     General: No scleral icterus.       Right eye: No discharge.        Left eye: No discharge.     Conjunctiva/sclera: Conjunctivae normal.     Pupils: Pupils are equal, round, and reactive to light.  Neck:     Thyroid: No thyromegaly.     Vascular: No JVD.     Trachea: No tracheal deviation.  Cardiovascular:     Rate and Rhythm: Normal rate and regular rhythm.     Heart sounds: Normal heart sounds. No murmur heard.    No friction rub. No gallop.  Pulmonary:     Effort: Pulmonary effort is normal. No respiratory distress.     Breath sounds: Normal breath sounds. No stridor. No wheezing or rales.  Abdominal:     General: Bowel sounds are normal. There is no distension.     Palpations: Abdomen is soft. There is no mass.     Tenderness: There is no  abdominal tenderness. There is no guarding or rebound.  Musculoskeletal:     Cervical back: Normal range of motion and neck supple.  Lymphadenopathy:     Cervical: No cervical adenopathy.  Skin:    General: Skin is warm.     Coloration: Skin  is not pale.     Findings: No erythema or rash.  Neurological:     Mental Status: He is alert and oriented to person, place, and time.     Cranial Nerves: No cranial nerve deficit.     Motor: No abnormal muscle tone.     Coordination: Coordination normal.     Deep Tendon Reflexes: Reflexes normal.  Psychiatric:        Behavior: Behavior normal.        Thought Content: Thought content normal.        Judgment: Judgment normal.       Assessment & Plan:  Pure hypercholesterolemia - Plan: CBC with Differential/Platelet, COMPLETE METABOLIC PANEL WITH GFR, Lipid panel  Hypothyroidism, unspecified type - Plan: TSH Recheck cholesterol today along with TSH and uptitrate the Neurontin as necessary to achieve a TSH therapeutic range.  Keep LDL cholesterol less than 161.  Increase Mounjaro to 10 mg subcu weekly to achieve additional weight loss

## 2023-03-28 LAB — LIPID PANEL
Cholesterol: 150 mg/dL (ref <200)
HDL: 60 mg/dL (ref >=40)
LDL Cholesterol (Calc): 73 mg/dL (calc)
Non-HDL Cholesterol (Calc): 90 mg/dL (calc) (ref <130)
Total CHOL/HDL Ratio: 2.5 (calc) (ref <5.0)
Triglycerides: 83 mg/dL (ref <150)

## 2023-03-28 LAB — COMPLETE METABOLIC PANEL WITH GFR
AG Ratio: 2.1 (calc) (ref 1.0–2.5)
ALT: 15 U/L (ref 9–46)
AST: 18 U/L (ref 10–35)
Albumin: 4.4 g/dL (ref 3.6–5.1)
Alkaline phosphatase (APISO): 57 U/L (ref 35–144)
BUN: 10 mg/dL (ref 7–25)
CO2: 29 mmol/L (ref 20–32)
Calcium: 9.4 mg/dL (ref 8.6–10.3)
Chloride: 103 mmol/L (ref 98–110)
Creat: 1.02 mg/dL (ref 0.70–1.35)
Globulin: 2.1 g/dL (calc) (ref 1.9–3.7)
Glucose, Bld: 107 mg/dL — ABNORMAL HIGH (ref 65–99)
Potassium: 4.9 mmol/L (ref 3.5–5.3)
Sodium: 139 mmol/L (ref 135–146)
Total Bilirubin: 0.4 mg/dL (ref 0.2–1.2)
Total Protein: 6.5 g/dL (ref 6.1–8.1)
eGFR: 82 mL/min/{1.73_m2} (ref >=60)

## 2023-03-28 LAB — CBC WITH DIFFERENTIAL/PLATELET
Absolute Monocytes: 469 cells/uL (ref 200–950)
HCT: 43.8 % (ref 38.5–50.0)
MCH: 31 pg (ref 27.0–33.0)
MCV: 92.4 fL (ref 80.0–100.0)
Monocytes Relative: 10.2 %
Platelets: 219 10*3/uL (ref 140–400)
RBC: 4.74 10*6/uL (ref 4.20–5.80)
RDW: 11.9 % (ref 11.0–15.0)
WBC: 4.6 10*3/uL (ref 3.8–10.8)

## 2023-03-28 LAB — TSH: TSH: 0.49 mIU/L (ref 0.40–4.50)

## 2023-04-06 ENCOUNTER — Other Ambulatory Visit: Payer: Self-pay

## 2023-04-06 ENCOUNTER — Telehealth: Payer: Self-pay | Admitting: Family Medicine

## 2023-04-06 DIAGNOSIS — E6609 Other obesity due to excess calories: Secondary | ICD-10-CM

## 2023-04-06 DIAGNOSIS — E785 Hyperlipidemia, unspecified: Secondary | ICD-10-CM

## 2023-04-06 MED ORDER — TIRZEPATIDE-WEIGHT MANAGEMENT 7.5 MG/0.5ML ~~LOC~~ SOAJ
7.5000 mg | SUBCUTANEOUS | 1 refills | Status: DC
Start: 2023-04-06 — End: 2023-04-12

## 2023-04-06 NOTE — Telephone Encounter (Signed)
Patient called to follow up on script provider wrote for new strength of tirzepatide (ZEPBOUND) 10 MG/0.5ML Pen .   Script written for 10MG ; pharmacy only has 7.5MG  in stock.   Patient requesting for provider to adjust script and sent to pharmacy  Pharmacy confirmed as:  [facility] - [place], [place] - [address] [place] [address] Phone: [phone]  Fax: [phone]    Please advise patient at [phone].

## 2023-04-10 ENCOUNTER — Other Ambulatory Visit: Payer: Self-pay | Admitting: Family Medicine

## 2023-04-10 IMAGING — MR MR SHOULDER*R* W/O CM
4 of 5 series · 20 of 40 positions shown · non-contrast
Comparison: None.

CLINICAL DATA: Right shoulder pain

EXAM:
MRI OF THE RIGHT SHOULDER WITHOUT CONTRAST
TECHNIQUE: Multiplanar, multisequence MR imaging of the shoulder was performed.
No intravenous contrast was administered.

[Series 6: T2 fat-sat · axial · right · 3.0mm · 0.47mm/px · z∈[-50,+41]mm · 7 of 30 slices shown (1 of 3)]
[im 1/30]
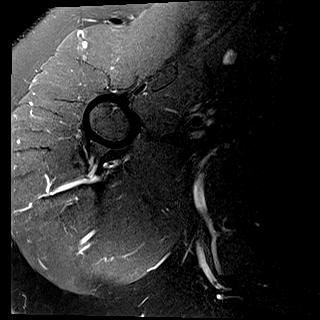
[im 4/30]
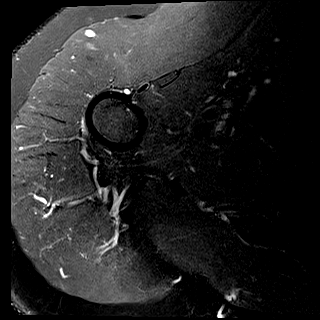
[im 10/30]
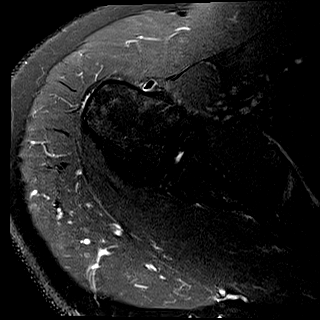
[im 13/30]
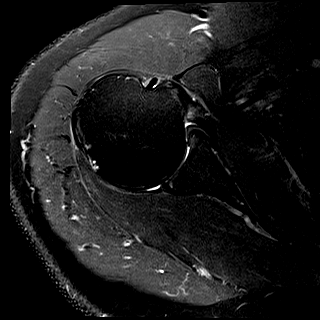
[im 17/30]
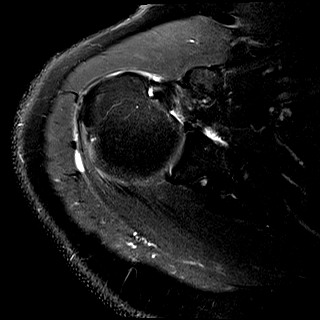
[im 20/30]
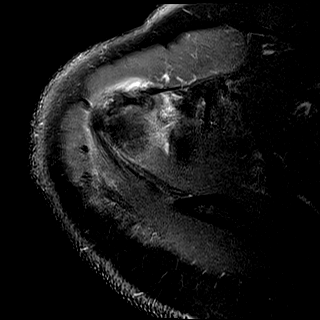
[im 26/30]
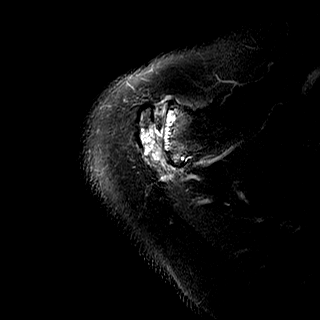

[Series 7: T2 fat-sat · oblique · right · 4.0mm · 0.22mm/px · 3 of 21 slices shown (2 of 3)]
[im 4/21]
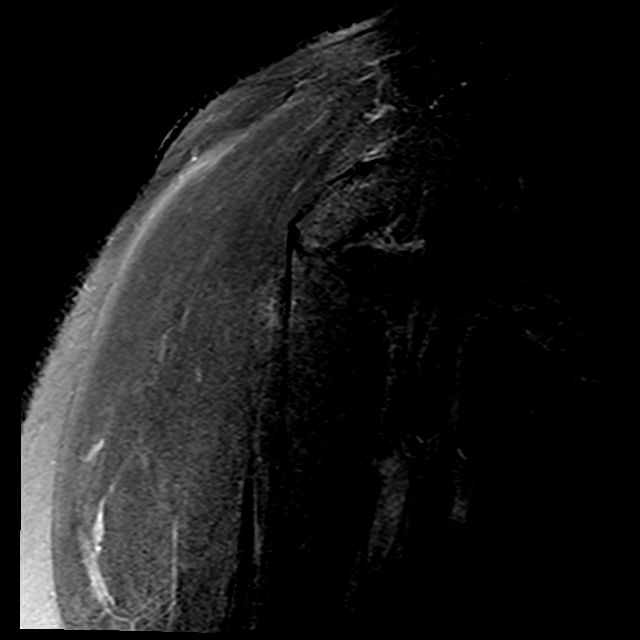
[im 11/21]
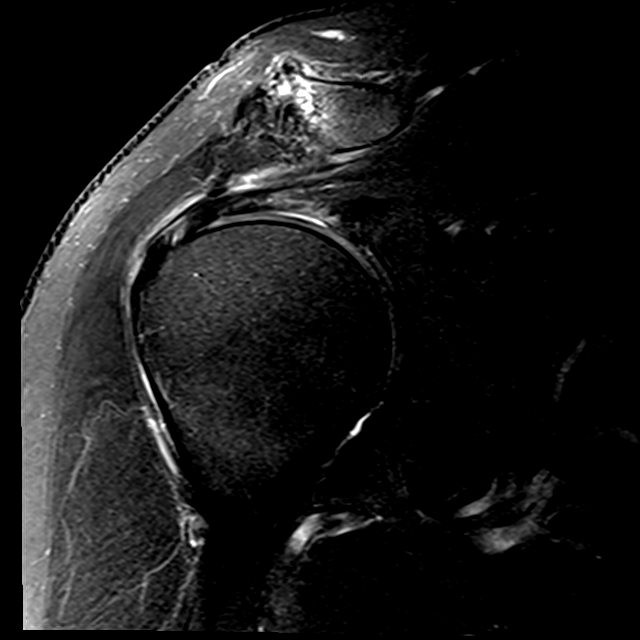
[im 17/21]
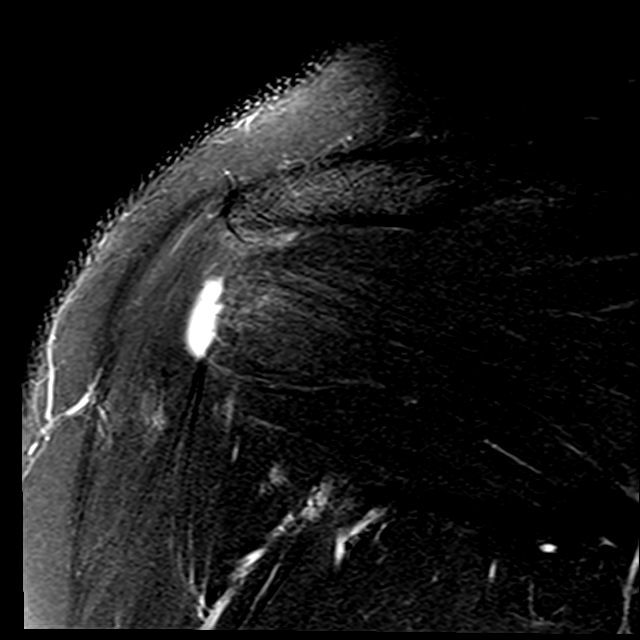

[Series 8: PD · oblique · right · 4.0mm · 0.22mm/px · 7 of 21 slices shown]
[im 1/21]
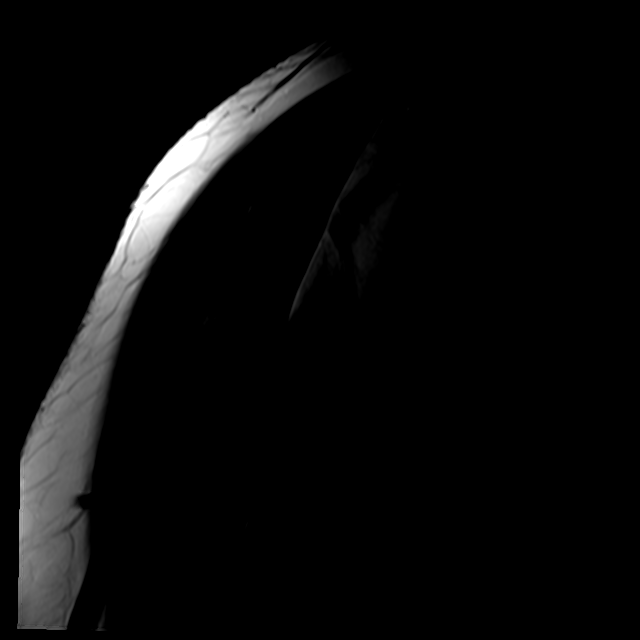
[im 4/21]
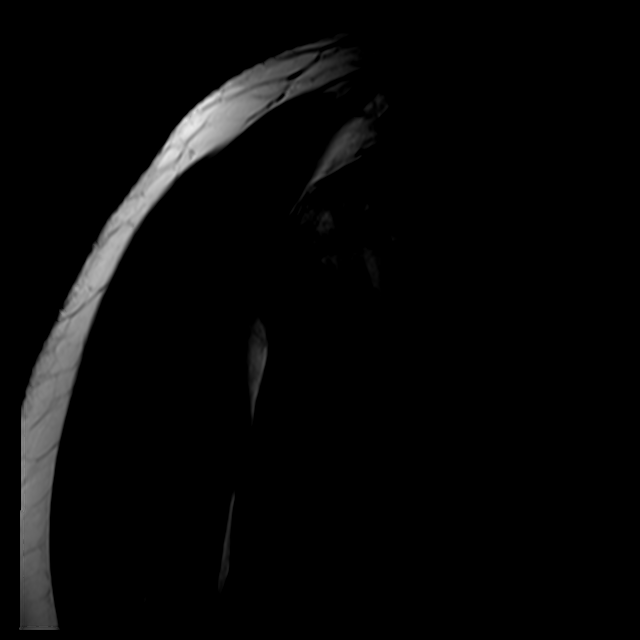
[im 7/21]
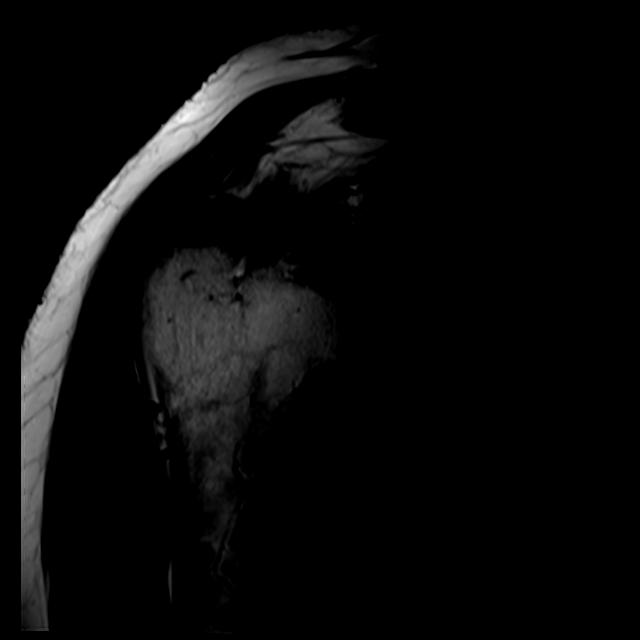
[im 11/21]
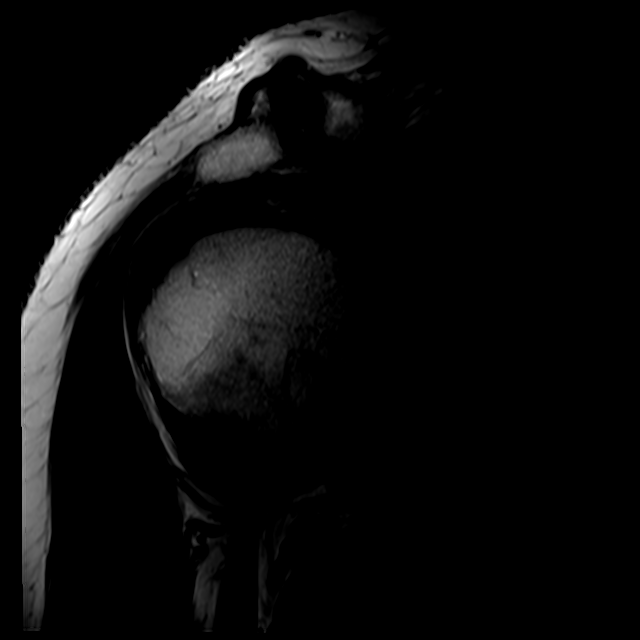
[im 14/21]
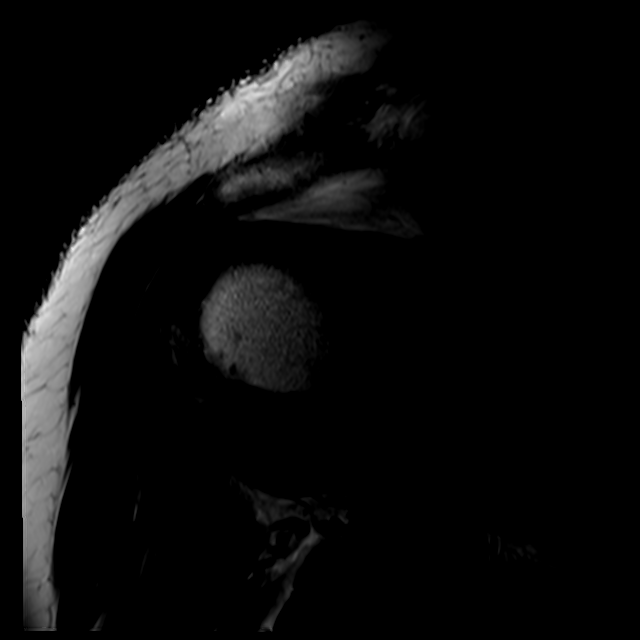
[im 17/21]
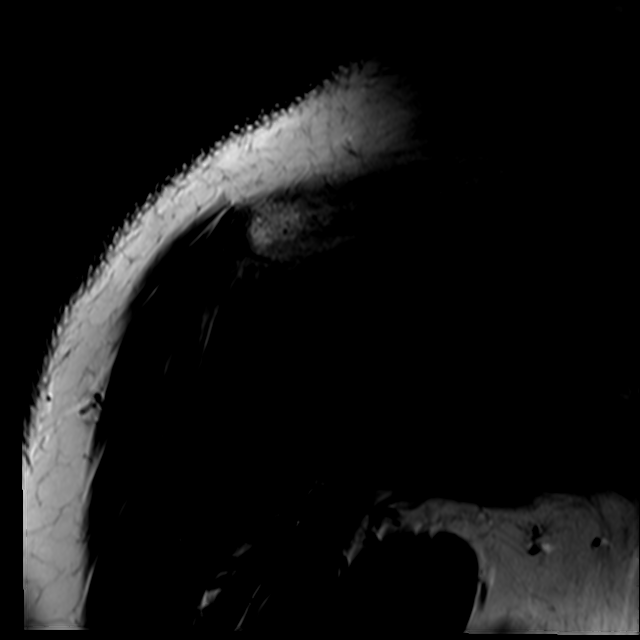
[im 21/21]
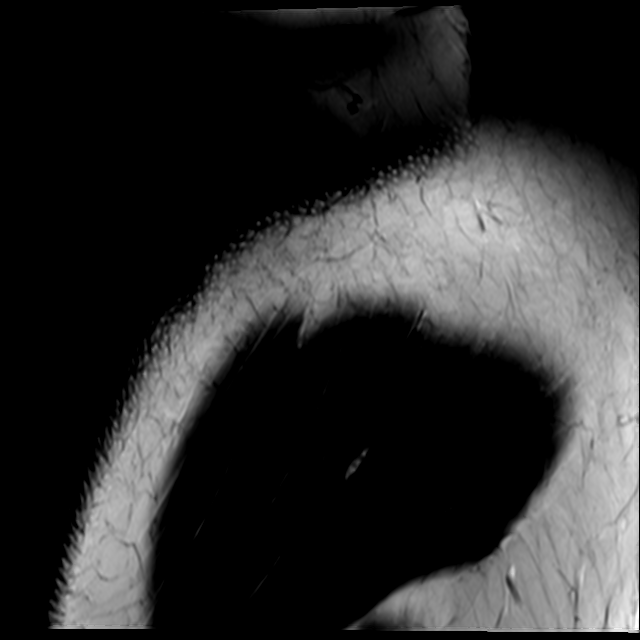

[Series 9: T2 fat-sat · oblique · right · 4.0mm · 0.44mm/px · 3 of 23 slices shown (3 of 3)]
[im 4/23]
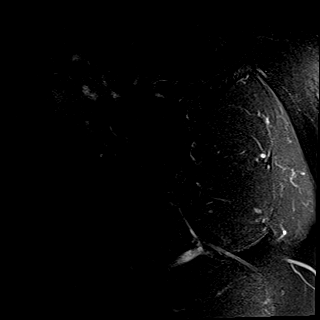
[im 13/23]
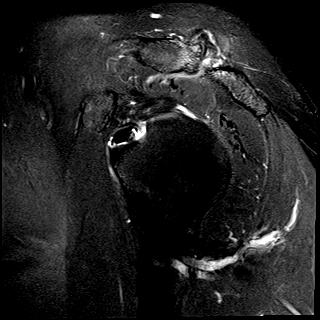
[im 19/23]
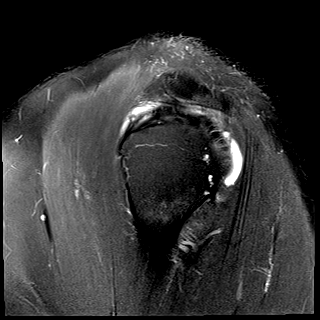

[20 of 40 positions shown; findings below may reference images not displayed]

FINDINGS: Rotator cuff: There is tendinosis of the distal supraspinatus tendon
with intermediate grade bursal sided tearing of the anterior fibers
at the footprint. The infraspinatus tendon is intact. Teres minor is
intact. Distal subscapularis tendinosis with interstitial tearing of
the cephalad fibers.

Muscles: No significant muscle atrophy.

Biceps Long Head: Accessory slip versus focal split tearing of the
long head biceps tendon as it enters the bicipital groove. There is
slight medialization of the long head biceps tendon into the sign
torn subscapularis tendon at the top of the bicipital groove.

Acromioclavicular Joint: Severe arthropathy of the acromioclavicular
joint. Small amount of subacromial/subdeltoid bursal fluid.

Glenohumeral Joint: No joint effusion. No chondral defect.

Labrum: There is degenerative superior labral tearing extending
anteriorly and posteriorly through the biceps labral anchor. The
tear may extend down the anterior labrum to the 4 o'clock position.

Bones: No fracture or dislocation. No aggressive osseous lesion.

Other: No fluid collection or hematoma.
IMPRESSION: Distal supraspinatus tendinosis with intermediate grade bursal sided
tearing of the anterior fibers at the footprint. Distal
subscapularis tendinosis with interstitial tearing of the cephalad
fibers and medialization of the long head biceps tendon between its
fibers.

Accessory tendon slip versus focal split tearing of the long head
biceps tendon as it enters the bicipital groove.

Mild subacromial-subdeltoid bursitis. No significant muscle atrophy.

Degenerative superior labral tearing extending anteriorly and
posteriorly through the biceps labral anchor, with possible
extension of labral tear down the anterior labrum to the 4 o'clock
position.

Severe AC joint arthropathy.

## 2023-04-10 NOTE — Telephone Encounter (Signed)
Prescription Request  04/10/2023  LOV: 03/27/2023  What is the name of the medication or equipment?   solifenacin (VESICARE) 10 MG tablet  **90 day script requested**  Have you contacted your pharmacy to request a refill? Yes   Which pharmacy would you like this sent to?  [id number] - [place], [place] - [address] AT [facility] [address] [place] [address] Phone: [phone] Fax: [phone]    Patient notified that their request is being sent to the clinical staff for review and that they should receive a response within 2 business days.   Please advise pharmacist.

## 2023-04-11 MED ORDER — SOLIFENACIN SUCCINATE 10 MG PO TABS: 10.0000 mg | ORAL_TABLET | Freq: Every day | ORAL | 0 refills | Status: DC

## 2023-04-11 NOTE — Telephone Encounter (Signed)
Requested Prescriptions  Pending Prescriptions Disp Refills   solifenacin (VESICARE) 10 MG tablet 90 tablet 0    Sig: Take 1 tablet (10 mg total) by mouth daily.     Urology:  Bladder Agents 2 Failed - 04/10/2023  2:06 PM      Failed - Valid encounter within last 12 months    Recent Outpatient Visits           1 year ago Upper respiratory tract infection, unspecified type   [facility] Valentino Nose, NP   2 years ago Urgency of urination   [facility] Tanya Nones, Priscille Heidelberg, MD   2 years ago General medical exam   [facility] Donita Brooks, MD   4 years ago General medical exam   [facility] Donita Brooks, MD   5 years ago 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   [facility] Pickard, Priscille Heidelberg, MD              Passed - Cr in normal range and within 360 days    Creat  Date Value Ref Range Status  03/27/2023 1.02 0.70 - 1.35 mg/dL Final         Passed - ALT in normal range and within 360 days    ALT  Date Value Ref Range Status  03/27/2023 15 9 - 46 U/L Final         Passed - AST in normal range and within 360 days    AST  Date Value Ref Range Status  03/27/2023 18 10 - 35 U/L Final

## 2023-04-12 ENCOUNTER — Telehealth: Payer: Self-pay | Admitting: Family Medicine

## 2023-04-12 ENCOUNTER — Other Ambulatory Visit: Payer: Self-pay

## 2023-04-12 DIAGNOSIS — E785 Hyperlipidemia, unspecified: Secondary | ICD-10-CM

## 2023-04-12 MED ORDER — ZEPBOUND 10 MG/0.5ML ~~LOC~~ SOAJ
10.0000 mg | SUBCUTANEOUS | 1 refills | Status: DC
Start: 2023-04-12 — End: 2023-04-20

## 2023-04-12 NOTE — Telephone Encounter (Signed)
Received call from Ty at [facility] pharmacy to request new script for  tirzepatide (ZEPBOUND) 10 MG/0.5ML Pen   Script initially sent to [facility].  Pharmacy:   [facility] [address] [place], [place] [address] Ph: [phone]  Please advise pharmacist at [phone]

## 2023-04-13 DIAGNOSIS — Z961 Presence of intraocular lens: Secondary | ICD-10-CM | POA: Diagnosis not present

## 2023-04-13 DIAGNOSIS — Z947 Corneal transplant status: Secondary | ICD-10-CM | POA: Diagnosis not present

## 2023-04-13 DIAGNOSIS — H43812 Vitreous degeneration, left eye: Secondary | ICD-10-CM | POA: Diagnosis not present

## 2023-04-20 ENCOUNTER — Encounter: Payer: Self-pay | Admitting: Family Medicine

## 2023-04-20 ENCOUNTER — Other Ambulatory Visit: Payer: Self-pay

## 2023-04-20 DIAGNOSIS — E785 Hyperlipidemia, unspecified: Secondary | ICD-10-CM

## 2023-04-20 MED ORDER — ZEPBOUND 10 MG/0.5ML ~~LOC~~ SOAJ
10.0000 mg | SUBCUTANEOUS | 1 refills | Status: DC
Start: 2023-04-20 — End: 2023-08-31

## 2023-04-20 MED ORDER — SIMVASTATIN 20 MG PO TABS
20.0000 mg | ORAL_TABLET | Freq: Every day | ORAL | 2 refills | Status: DC
Start: 2023-04-20 — End: 2023-12-26

## 2023-04-23 ENCOUNTER — Telehealth: Payer: Self-pay

## 2023-04-23 NOTE — Telephone Encounter (Signed)
PA-Zepound 10mg  has been sent to plan

## 2023-04-25 ENCOUNTER — Other Ambulatory Visit: Payer: Self-pay | Admitting: Family Medicine

## 2023-04-25 NOTE — Telephone Encounter (Signed)
PA-Zepound   Outcome Approved today by [facility] 2017 CaseId:[id number];Status:Approved;Review Type:Prior Auth;Coverage Start Date:03/26/2023;Coverage End Date:04/24/2024; Authorization Expiration Date: 04/23/2024

## 2023-04-26 NOTE — Telephone Encounter (Signed)
Requested Prescriptions  Pending Prescriptions Disp Refills   traZODone (DESYREL) 50 MG tablet [Pharmacy Med Name: TRAZODONE HCL TABS 50MG ] 90 tablet 1    Sig: TAKE ONE-HALF (1/2) TO ONE TABLET AT BEDTIME AS NEEDED FOR SLEEP     Psychiatry: Antidepressants - Serotonin Modulator Failed - 04/25/2023 10:25 AM      Failed - Valid encounter within last 6 months    Recent Outpatient Visits           1 year ago Upper respiratory tract infection, unspecified type   [facility] Valentino Nose, NP   2 years ago Urgency of urination   [facility] Pickard, Priscille Heidelberg, MD   2 years ago General medical exam   [facility] Donita Brooks, MD   4 years ago General medical exam   [facility] Donita Brooks, MD   5 years ago 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   [facility] Pickard, Priscille Heidelberg, MD

## 2023-05-03 DIAGNOSIS — H43812 Vitreous degeneration, left eye: Secondary | ICD-10-CM | POA: Diagnosis not present

## 2023-05-03 DIAGNOSIS — Z947 Corneal transplant status: Secondary | ICD-10-CM | POA: Diagnosis not present

## 2023-05-03 DIAGNOSIS — Z961 Presence of intraocular lens: Secondary | ICD-10-CM | POA: Diagnosis not present

## 2023-05-24 DIAGNOSIS — G4733 Obstructive sleep apnea (adult) (pediatric): Secondary | ICD-10-CM | POA: Diagnosis not present

## 2023-06-17 ENCOUNTER — Encounter: Payer: Self-pay | Admitting: Family Medicine

## 2023-06-18 DIAGNOSIS — Z86010 Personal history of colonic polyps: Secondary | ICD-10-CM | POA: Diagnosis not present

## 2023-06-18 DIAGNOSIS — E6609 Other obesity due to excess calories: Secondary | ICD-10-CM | POA: Diagnosis not present

## 2023-06-18 DIAGNOSIS — R152 Fecal urgency: Secondary | ICD-10-CM | POA: Diagnosis not present

## 2023-06-19 ENCOUNTER — Other Ambulatory Visit: Payer: Self-pay | Admitting: Family Medicine

## 2023-06-19 MED ORDER — NIRMATRELVIR/RITONAVIR (PAXLOVID)TABLET: 3.0000 | ORAL_TABLET | Freq: Two times a day (BID) | ORAL | 0 refills | Status: AC

## 2023-08-31 ENCOUNTER — Ambulatory Visit (INDEPENDENT_AMBULATORY_CARE_PROVIDER_SITE_OTHER): Payer: BC Managed Care – PPO | Admitting: Family Medicine

## 2023-08-31 ENCOUNTER — Encounter: Payer: Self-pay | Admitting: Family Medicine

## 2023-08-31 VITALS — BP 120/78 | HR 59 | Temp 98.6°F | Ht 70.0 in | Wt 207.5 lb

## 2023-08-31 DIAGNOSIS — Z6838 Body mass index (BMI) 38.0-38.9, adult: Secondary | ICD-10-CM

## 2023-08-31 DIAGNOSIS — E039 Hypothyroidism, unspecified: Secondary | ICD-10-CM | POA: Diagnosis not present

## 2023-08-31 DIAGNOSIS — E66812 Obesity, class 2: Secondary | ICD-10-CM

## 2023-08-31 DIAGNOSIS — E785 Hyperlipidemia, unspecified: Secondary | ICD-10-CM | POA: Diagnosis not present

## 2023-08-31 DIAGNOSIS — Z125 Encounter for screening for malignant neoplasm of prostate: Secondary | ICD-10-CM

## 2023-08-31 DIAGNOSIS — Z0001 Encounter for general adult medical examination with abnormal findings: Secondary | ICD-10-CM

## 2023-08-31 DIAGNOSIS — Z0000 Encounter for general adult medical examination without abnormal findings: Secondary | ICD-10-CM

## 2023-08-31 MED ORDER — ZEPBOUND 10 MG/0.5ML ~~LOC~~ SOAJ: 10.0000 mg | SUBCUTANEOUS | 3 refills | Status: DC

## 2023-08-31 MED ORDER — ZEPBOUND 10 MG/0.5ML ~~LOC~~ SOAJ: 10.0000 mg | SUBCUTANEOUS | 11 refills | Status: DC

## 2023-08-31 NOTE — Progress Notes (Signed)
Subjective:    Patient ID: Juan Stone, male    DOB: [birth year], 62 y.o.   MRN: [id number]  HPI Patient is a 62 year old white male here today for complete physical exam.  Patient had his colonoscopy in 2017.  His gastroenterologist recommended repeat colonoscopy in 7 years therefore he is due now.  Patient has already scheduled his colonoscopy for next week.  He is due to check a PSA.  I am very proud of the patient.  He continues to lose weight.  Overall he is lost more than 60 pounds on stepdown.  He is currently tolerating the 10 mg dose subcutaneously weekly without any issues.  He denies any abdominal pain or nausea or vomiting.  He would like to continue the present dose.  However with the continued weight loss I am concerned that we may need to alter his dosage of levothyroxine.  Therefore we will check his TSH today.  He is already had his flu shot, his COVID shot, RSV, Shingrix.  He will be due for a pneumonia vaccine next year.  Otherwise all of his immunizations are up-to-date.   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 Coralyn Pear   CORNEAL TRANSPLANT     Fuch's dystorphy (bilateral)   HERNIA REPAIR N/A    [employer] 05/25/2020   Current Outpatient Medications on File Prior to Visit  Medication Sig Dispense Refill   clotrimazole-betamethasone (LOTRISONE) cream APPLY TO AFFECTED AREA TWICE A DAY 30 g 0   EPINEPHrine 0.3 mg/0.3 mL IJ SOAJ injection Inject 0.3 mg into the muscle as needed for anaphylaxis. 1 each 0   finasteride (PROPECIA) 1 MG tablet Take 1 tablet (1 mg total) by mouth daily. 90 tablet 3   fluorometholone (FML) 0.1 % ophthalmic suspension 1 drop every 4 (four) hours.     fluticasone (FLONASE) 50 MCG/ACT  nasal spray Place 2 sprays into both nostrils daily as needed for allergies or rhinitis. 16 g 11   hydrocortisone-pramoxine (ANALPRAM HC) 2.5-1 % rectal cream Place 1 application rectally 3 (three) times daily. Please dispense (2) tubes 60 g 0   levothyroxine (SYNTHROID) 175 MCG tablet Take 1 tablet (175 mcg total) by mouth daily. 90 tablet 1   minoxidil (LONITEN) 2.5 MG tablet Take 1 tablet (2.5 mg total) by mouth 2 (two) times daily. 180 tablet 3   mometasone (ELOCON) 0.1 % cream Apply 1 application topically daily. 45 g 0   Multiple Vitamins tablet Take 1 tablet by mouth.     simvastatin (ZOCOR) 20 MG tablet Take 1 tablet (20 mg total) by mouth at bedtime. 90 tablet 2   solifenacin (VESICARE) 10 MG tablet Take 1 tablet (10 mg total) by mouth daily. 90 tablet 0   tirzepatide (ZEPBOUND) 10 MG/0.5ML Pen Inject 10 mg into the skin once a week. 6 mL 1   traZODone (DESYREL) 50 MG tablet TAKE ONE-HALF (1/2) TO ONE TABLET AT BEDTIME AS NEEDED FOR SLEEP 90 tablet 1   No current facility-administered medications on file prior to visit.   Allergies  Allergen Reactions   Alpha-D-Galactosidase Nausea And Vomiting and Hives   Other Diarrhea, Hives, Itching and Rash   Social History  Socioeconomic History   Marital status: Married    Spouse name: Not on file   Number of children: Not on file   Years of education: Not on file   Highest education level: Bachelor's degree (e.g., BA, AB, BS)  Occupational History   Not on file  Tobacco Use   Smoking status: Never    Passive exposure: Past   Smokeless tobacco: Never  Substance and Sexual Activity   Alcohol use: No    Comment: Ocassional   Drug use: No   Sexual activity: Not on file  Other Topics Concern   Not on file  Social History Narrative   Not on file   Social Drivers of Health   Financial Resource Strain: Low Risk  (08/30/2023)   Overall Financial Resource Strain (CARDIA)    Difficulty of Paying Living Expenses: Not hard at all   Food Insecurity: No Food Insecurity (08/30/2023)   Hunger Vital Sign    Worried About Running Out of Food in the Last Year: Never true    Ran Out of Food in the Last Year: Never true  Transportation Needs: No Transportation Needs (08/30/2023)   PRAPARE - Administrator, Civil Service (Medical): No    Lack of Transportation (Non-Medical): No  Physical Activity: Insufficiently Active (08/30/2023)   Exercise Vital Sign    Days of Exercise per Week: 3 days    Minutes of Exercise per Session: 20 min  Stress: No Stress Concern Present (08/30/2023)   [employer] of Occupational Health - Occupational Stress Questionnaire    Feeling of Stress : Only a little  Social Connections: Unknown (08/30/2023)   Social Connection and Isolation Panel [NHANES]    Frequency of Communication with Friends and Family: Once a week    Frequency of Social Gatherings with Friends and Family: Patient declined    Attends Religious Services: Patient declined    Database administrator or Organizations: Patient declined    Attends Engineer, structural: Not on file    Marital Status: Married  Catering manager Violence: Not on file      Review of Systems  All other systems reviewed and are negative.      Objective:   Physical Exam Vitals reviewed.  Constitutional:      General: He is not in acute distress.    Appearance: He is well-developed. He is not diaphoretic.  HENT:     Head: Normocephalic and atraumatic.     Right Ear: Tympanic membrane, ear canal and external ear normal.     Left Ear: Tympanic membrane, ear canal and external ear normal.     Nose: Nose normal.     Mouth/Throat:     Pharynx: No oropharyngeal exudate.  Eyes:     General: No scleral icterus.       Right eye: No discharge.        Left eye: No discharge.     Conjunctiva/sclera: Conjunctivae normal.     Pupils: Pupils are equal, round, and reactive to light.  Neck:     Thyroid: No thyromegaly.      Vascular: No JVD.     Trachea: No tracheal deviation.  Cardiovascular:     Rate and Rhythm: Normal rate and regular rhythm.     Heart sounds: Normal heart sounds. No murmur heard.    No friction rub. No gallop.  Pulmonary:     Effort: Pulmonary effort is normal. No respiratory distress.     Breath sounds: Normal  breath sounds. No stridor. No wheezing or rales.  Abdominal:     General: Bowel sounds are normal. There is no distension.     Palpations: Abdomen is soft. There is no mass.     Tenderness: There is no abdominal tenderness. There is no guarding or rebound.  Musculoskeletal:     Cervical back: Normal range of motion and neck supple.  Lymphadenopathy:     Cervical: No cervical adenopathy.  Skin:    General: Skin is warm.     Coloration: Skin is not pale.     Findings: No erythema or rash.  Neurological:     Mental Status: He is alert and oriented to person, place, and time.     Cranial Nerves: No cranial nerve deficit.     Motor: No abnormal muscle tone.     Coordination: Coordination normal.     Deep Tendon Reflexes: Reflexes normal.  Psychiatric:        Behavior: Behavior normal.        Thought Content: Thought content normal.        Judgment: Judgment normal.   Patient has large varicose veins particularly in his right leg along his medial right thigh and his medial anterior right shin.  These are asymptomatic and do not hurt him.  He has excellent peripheral pulses.        Assessment & Plan:  General medical exam - Plan: CBC with Differential/Platelet, COMPLETE METABOLIC PANEL WITH GFR, Lipid panel, TSH, PSA  Prostate cancer screening - Plan: PSA  Hyperlipidemia, unspecified hyperlipidemia type - Plan: CBC with Differential/Platelet, COMPLETE METABOLIC PANEL WITH GFR, Lipid panel, TSH, tirzepatide (ZEPBOUND) 10 MG/0.5ML Pen, CT CARDIAC SCORING (SELF PAY ONLY), DISCONTINUED: tirzepatide (ZEPBOUND) 10 MG/0.5ML Pen  Hypothyroidism, unspecified type - Plan:  TSH  Class 2 severe obesity due to excess calories with serious comorbidity and body mass index (BMI) of 38.0 to 38.9 in adult [facility]) - Plan: CBC with Differential/Platelet, COMPLETE METABOLIC PANEL WITH GFR, Lipid panel, TSH, tirzepatide (ZEPBOUND) 10 MG/0.5ML Pen, DISCONTINUED: tirzepatide (ZEPBOUND) 10 MG/0.5ML Pen I am very proud of the patient for his weight loss.  We will continue Zepbound at the current dose.  Check a TSH and titrate the levothyroxine to achieve a TSH within therapeutic range.  Also check CBC CMP and a lipid panel.  Screen for prostate cancer with a PSA.  We discussed a coronary artery calcium score.  He would like to proceed with that test.  If it is considerably elevated, his goal LDL cholesterol would be less than 55.  Immunizations are up-to-date.  Regular anticipatory guidance is provided

## 2023-09-01 LAB — LIPID PANEL
Cholesterol: 156 mg/dL (ref <200)
HDL: 64 mg/dL (ref >=40)
LDL Cholesterol (Calc): 77 mg/dL
Non-HDL Cholesterol (Calc): 92 mg/dL (ref <130)
Total CHOL/HDL Ratio: 2.4 (calc) (ref <5.0)
Triglycerides: 72 mg/dL (ref <150)

## 2023-09-01 LAB — CBC WITH DIFFERENTIAL/PLATELET
Absolute Lymphocytes: 1180 {cells}/uL (ref 850–3900)
Absolute Monocytes: 484 {cells}/uL (ref 200–950)
Basophils Absolute: 42 {cells}/uL (ref 0–200)
Basophils Relative: 0.9 %
Eosinophils Absolute: 113 {cells}/uL (ref 15–500)
Eosinophils Relative: 2.4 %
HCT: 47.1 % (ref 38.5–50.0)
Hemoglobin: 15.6 g/dL (ref 13.2–17.1)
MCH: 31.4 pg (ref 27.0–33.0)
MCHC: 33.1 g/dL (ref 32.0–36.0)
MCV: 94.8 fL (ref 80.0–100.0)
MPV: 9.8 fL (ref 7.5–12.5)
Monocytes Relative: 10.3 %
Neutro Abs: 2881 {cells}/uL (ref 1500–7800)
Neutrophils Relative %: 61.3 %
Platelets: 274 10*3/uL (ref 140–400)
RBC: 4.97 10*6/uL (ref 4.20–5.80)
RDW: 12.1 % (ref 11.0–15.0)
Total Lymphocyte: 25.1 %
WBC: 4.7 10*3/uL (ref 3.8–10.8)

## 2023-09-01 LAB — COMPLETE METABOLIC PANEL WITH GFR
AG Ratio: 1.9 (calc) (ref 1.0–2.5)
ALT: 16 U/L (ref 9–46)
AST: 22 U/L (ref 10–35)
Albumin: 4.6 g/dL (ref 3.6–5.1)
Alkaline phosphatase (APISO): 63 U/L (ref 35–144)
BUN: 7 mg/dL (ref 7–25)
CO2: 27 mmol/L (ref 20–32)
Calcium: 10 mg/dL (ref 8.6–10.3)
Chloride: 101 mmol/L (ref 98–110)
Creat: 0.98 mg/dL (ref 0.70–1.35)
Globulin: 2.4 g/dL (ref 1.9–3.7)
Glucose, Bld: 80 mg/dL (ref 65–99)
Potassium: 5.4 mmol/L — ABNORMAL HIGH (ref 3.5–5.3)
Sodium: 137 mmol/L (ref 135–146)
Total Bilirubin: 0.5 mg/dL (ref 0.2–1.2)
Total Protein: 7 g/dL (ref 6.1–8.1)
eGFR: 86 mL/min/{1.73_m2} (ref >=60)

## 2023-09-01 LAB — PSA: PSA: 0.35 ng/mL (ref <=4.00)

## 2023-09-01 LAB — TSH: TSH: 1.89 m[IU]/L (ref 0.40–4.50)

## 2023-09-03 ENCOUNTER — Other Ambulatory Visit: Payer: Self-pay | Admitting: Family Medicine

## 2023-09-03 DIAGNOSIS — E039 Hypothyroidism, unspecified: Secondary | ICD-10-CM

## 2023-09-06 DIAGNOSIS — Z1211 Encounter for screening for malignant neoplasm of colon: Secondary | ICD-10-CM | POA: Diagnosis not present

## 2023-09-06 DIAGNOSIS — Z09 Encounter for follow-up examination after completed treatment for conditions other than malignant neoplasm: Secondary | ICD-10-CM | POA: Diagnosis not present

## 2023-09-06 DIAGNOSIS — Z860101 Personal history of adenomatous and serrated colon polyps: Secondary | ICD-10-CM | POA: Diagnosis not present

## 2023-09-06 DIAGNOSIS — Q438 Other specified congenital malformations of intestine: Secondary | ICD-10-CM | POA: Diagnosis not present

## 2023-09-06 DIAGNOSIS — K5730 Diverticulosis of large intestine without perforation or abscess without bleeding: Secondary | ICD-10-CM | POA: Diagnosis not present

## 2023-09-06 LAB — HM COLONOSCOPY

## 2023-09-17 DIAGNOSIS — G4733 Obstructive sleep apnea (adult) (pediatric): Secondary | ICD-10-CM | POA: Diagnosis not present

## 2023-09-26 DIAGNOSIS — R197 Diarrhea, unspecified: Secondary | ICD-10-CM | POA: Diagnosis not present

## 2023-10-04 ENCOUNTER — Telehealth: Payer: Self-pay | Admitting: Family Medicine

## 2023-10-04 NOTE — Telephone Encounter (Signed)
Prescription Request  10/04/2023  LOV: 08/31/2023  What is the name of the medication or equipment?   traZODone (DESYREL) 50 MG tablet  **New script requested for 90 days**   Have you contacted your pharmacy to request a refill? Yes   Which pharmacy would you like this sent to?  [facility] - [place], [place] - [facility] [address] [place] [address] Phone: [phone] Fax: [phone]    Patient notified that their request is being sent to the clinical staff for review and that they should receive a response within 2 business days.   Please advise pharmacist.

## 2023-10-05 ENCOUNTER — Other Ambulatory Visit: Payer: Self-pay | Admitting: Family Medicine

## 2023-10-05 MED ORDER — TRAZODONE HCL 50 MG PO TABS: 50.0000 mg | ORAL_TABLET | Freq: Every evening | ORAL | 3 refills | Status: DC | PRN

## 2023-10-17 ENCOUNTER — Other Ambulatory Visit (HOSPITAL_COMMUNITY): Payer: BC Managed Care – PPO

## 2023-10-19 ENCOUNTER — Ambulatory Visit (HOSPITAL_COMMUNITY)
Admission: RE | Admit: 2023-10-19 | Discharge: 2023-10-19 | Disposition: A | Discharge status: Home / Self Care | Payer: Self-pay | Source: Ambulatory Visit | Attending: Family Medicine | Admitting: Family Medicine

## 2023-10-19 DIAGNOSIS — Z136 Encounter for screening for cardiovascular disorders: Secondary | ICD-10-CM | POA: Diagnosis not present

## 2023-10-19 DIAGNOSIS — E785 Hyperlipidemia, unspecified: Secondary | ICD-10-CM | POA: Insufficient documentation

## 2023-10-20 ENCOUNTER — Encounter: Payer: Self-pay | Admitting: Family Medicine

## 2023-10-22 ENCOUNTER — Other Ambulatory Visit: Payer: Self-pay

## 2023-10-22 DIAGNOSIS — R931 Abnormal findings on diagnostic imaging of heart and coronary circulation: Secondary | ICD-10-CM

## 2023-11-10 ENCOUNTER — Other Ambulatory Visit: Payer: Self-pay | Admitting: Family Medicine

## 2023-11-10 DIAGNOSIS — E785 Hyperlipidemia, unspecified: Secondary | ICD-10-CM

## 2023-12-24 NOTE — Progress Notes (Unsigned)
 Cardiology Office Note:  .   Date:  12/26/2023 ID:  Juan Stone, DOB 01/10/1961, MRN [id number] PCP: Donita Brooks, MD [facility] Providers Cardiologist:  None   Patient Profile: .      PMH Coronary artery disease CT Calcium score 10/31/2023 CAC score 1468 (95th percentile) LM 90, LAD 780, LCx 597, RCA 1.4 Hyperlipidemia Hypothyroidism Obesity Alpha-gal allergy OSA on CPAP     History of Present Illness: .   Juan Stone is a very pleasant 63 y.o. male  who is here today for new patient consult for elevated coronary artery calcium score and is accompanied by his wife. He reports 10+ year history of high cholesterol with levels that have been well-controlled on simvastatin. He has lost about 75 pounds over the past year, which he attributes to tirzepatide. He denies chest discomfort, shortness of breath, palpitations, orthopnea, edema, PND, presyncope, syncope.  Admits he does feel lightheaded when he first wakes up in the morning but this resolves after sitting for a few minutes.  He is feeling tired quite often, but is unsure if this is related to his age or other factors. He continues to work full time and maintains an active lifestyle, including regular yard work, and averages about 8,000 to 9,000 steps per day. No specific exercise routine. His diet is primarily chicken and fish due to an allergy to beef and pork. He admits to consuming a significant amount of diet soda, but otherwise avoids sugary drinks. He uses a CPAP machine for sleep apnea and reports no issues with its use.  Family history: His family history includes COPD in his mother; Heart disease in his father; Hyperlipidemia in his father.  Father pacemaker in his 73s, lived to age 49 Aunt with stroke Older brother - hypertension  Discussed the use of AI scribe software for clinical note transcription with the patient, who gave verbal consent to proceed.  ASCVD Risk Score: ASCVD (Atherosclerotic  Cardiovascular Disease) Risk Algorithm including Known ASCVD from AHA/ACC from [email or web address]  on 12/26/2023 ** All calculations should be rechecked by clinician prior to use **  RESULT SUMMARY: 8.1 % Risk of cardiovascular event (coronary or stroke death or non-fatal MI or stroke) in next 10 years.  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  No results found for: "LIPOA"    ROS: See HPI       Studies Reviewed: Marland Kitchen   EKG Interpretation Date/Time:  Wednesday December 26 2023 08:51:38 EDT Ventricular Rate:  57 PR Interval:  170 QRS Duration:  106 QT Interval:  402 QTC Calculation: 391 R Axis:   65  Text Interpretation: Sinus bradycardia Nonspecific ST abnormality When compared with ECG of 16-Mar-2011 20:33, No significant change was found Confirmed by Eligha Bridegroom [phone]) on 12/26/2023 9:02:06 AM      Risk Assessment/Calculations:  Physical Exam:   VS: BP 118/62   Pulse 72   Ht 5\' 10"  (1.778 m)   Wt 207 lb 9.6 oz (94.2 kg)   SpO2 100%   BMI 29.79 kg/m   Wt Readings from Last 3 Encounters:  12/26/23 207 lb 9.6 oz (94.2 kg)  08/31/23 207 lb 8 oz (94.1 kg)  03/27/23 241 lb 3.2 oz (109.4 kg)     GEN: Well nourished, well developed in no acute distress NECK: No JVD; No carotid bruits CARDIAC: RRR, no murmurs, rubs, gallops RESPIRATORY:  Clear to auscultation without rales, wheezing or rhonchi  ABDOMEN: Soft,  non-tender, non-distended EXTREMITIES:  No edema; No deformity     ASSESSMENT AND PLAN: .    CAD: CT calcium score completed 10/31/23 with CAC score of 1468 (95th percentile) with distribution of calcium mainly in LAD and LCx. EKG today reveals sinus bradycardia and nonspecific TW abnormality.  He denies chest pain or shortness of breath but admits that activity is mostly walking around and doing yard work, no dedicated time of exertional activity. Due to significant calcification into main arteries along with ASCVD risk score > 7.5%, we will get cardiac PET CT for evaluation of ischemia. Encouraged focus on secondary prevention including heart healthy mostly plant based diet avoiding saturated fat, processed foods, simple carbohydrates, and sugar along with aiming for at least 150 minutes of moderate intensity exercise each week.   Hyperlipidemia LDL goal < 70: Lipid panel completed 08/31/2023 with total cholesterol 156, HDL 64, triglycerides 72, and LDL-C 77.  He has been on simvastatin for many years.  He is agreeable to stop simvastatin and start rosuvastatin 20 mg daily.  We will recheck lipids with NMR lipid profile and LP(a) in 2 to 3 months.  Sinus bradycardia: He is asymptomatic. He is not on AV nodal blocking agent.   OSA on CPAP: He reports compliance with CPAP along with annual follow-up. No acute concerns today.   CV Risk Assessment: ASCVD risk score is 8.1%. He does not have additional risk factors of hypertension or diabetes.  We discussed the increase in risk with age and history of hyperlipidemia.  We will get cardiac PET/CT for evaluation of ischemia.  We are changing statin to high intensity rosuvastatin 20 mg daily.  Will get LP(a) for further risk stratification.  Plan/Goals: 1: Gradually increase walking or other aerobic activity to aim for 150 minutes of moderate intensity exercise each week 2: Consider increasing intake of high protein, high fiber foods to see if fatigue  improves. Avoid saturated fat, processed foods, sugar, and simple carbohydrates. 3: Limit diet soda       Informed Consent   Shared Decision Making/Informed Consent The risks [chest pain, shortness of breath, cardiac arrhythmias, dizziness, blood pressure fluctuations, myocardial infarction, stroke/transient ischemic attack, nausea, vomiting, allergic reaction, radiation exposure, metallic taste sensation and life-threatening complications (estimated to be 1 in 10,000)], benefits (risk stratification, diagnosing coronary artery disease, treatment guidance) and alternatives of a cardiac PET stress test were discussed in detail with Mr. Mauriello and he agrees to proceed.     Disposition: 3 months with me  Signed, Eligha Bridegroom, NP-C

## 2023-12-26 ENCOUNTER — Ambulatory Visit (INDEPENDENT_AMBULATORY_CARE_PROVIDER_SITE_OTHER): Payer: BC Managed Care – PPO | Admitting: Nurse Practitioner

## 2023-12-26 ENCOUNTER — Encounter (HOSPITAL_BASED_OUTPATIENT_CLINIC_OR_DEPARTMENT_OTHER): Payer: Self-pay | Admitting: Nurse Practitioner

## 2023-12-26 VITALS — BP 118/62 | HR 72 | Ht 70.0 in | Wt 207.6 lb

## 2023-12-26 DIAGNOSIS — Z7189 Other specified counseling: Secondary | ICD-10-CM | POA: Diagnosis not present

## 2023-12-26 DIAGNOSIS — E785 Hyperlipidemia, unspecified: Secondary | ICD-10-CM | POA: Diagnosis not present

## 2023-12-26 DIAGNOSIS — I2510 Atherosclerotic heart disease of native coronary artery without angina pectoris: Secondary | ICD-10-CM | POA: Diagnosis not present

## 2023-12-26 DIAGNOSIS — G4733 Obstructive sleep apnea (adult) (pediatric): Secondary | ICD-10-CM

## 2023-12-26 MED ORDER — ROSUVASTATIN CALCIUM 20 MG PO TABS: 20.0000 mg | ORAL_TABLET | Freq: Every day | ORAL | 3 refills | Status: DC

## 2023-12-26 NOTE — Patient Instructions (Signed)
 Medication Instructions:  Your physician has recommended you make the following change in your medication:   Stop: simvastatin   Start: Rosuvastatin 20mg  daily   *If you need a refill on your cardiac medications before your next appointment, please call your pharmacy*  Lab Work: 2-3 months Fasting NMR, CMP & LPA   Testing/Procedures:    Please report to Radiology at the [facility] Main Entrance 30 minutes early for your test.  [address] [place], [place] [address]                         OR   Please report to Radiology at [facility] Main Entrance, medical mall, 30 mins prior to your test.  [address]  [place], [place]  How to Prepare for Your Cardiac PET/CT Stress Test:  Nothing to eat or drink, except water, 3 hours prior to arrival time.  NO caffeine/decaffeinated products, or chocolate 12 hours prior to arrival. (Please note decaffeinated beverages (teas/coffees) still contain caffeine).  If you have caffeine within 12 hours prior, the test will need to be rescheduled.  Medication instructions: Do not take erectile dysfunction medications for 72 hours prior to test (sildenafil, tadalafil) Do not take nitrates (isosorbide mononitrate, Ranexa) the day before or day of test Do not take tamsulosin the day before or morning of test Hold theophylline containing medications for 12 hours. Hold Dipyridamole 48 hours prior to the test.  Diabetic Preparation: If able to eat breakfast prior to 3 hour fasting, you may take all medications, including your insulin. Do not worry if you miss your breakfast dose of insulin - start at your next meal. If you do not eat prior to 3 hour fast-Hold all diabetes (oral and insulin) medications. Patients who wear a continuous glucose monitor MUST remove the device prior to scanning.  You may take your remaining medications with water.  NO perfume, cologne or lotion on chest or abdomen  area. FEMALES - Please avoid wearing dresses to this appointment.  Total time is 1 to 2 hours; you may want to bring reading material for the waiting time.  IF YOU THINK YOU MAY BE PREGNANT, OR ARE NURSING PLEASE INFORM THE TECHNOLOGIST.  In preparation for your appointment, medication and supplies will be purchased.  Appointment availability is limited, so if you need to cancel or reschedule, please call the Radiology Department Scheduler at [phone] 24 hours in advance to avoid a cancellation fee of $100.00  What to Expect When you Arrive:  Once you arrive and check in for your appointment, you will be taken to a preparation room within the Radiology Department.  A technologist or Nurse will obtain your medical history, verify that you are correctly prepped for the exam, and explain the procedure.  Afterwards, an IV will be started in your arm and electrodes will be placed on your skin for EKG monitoring during the stress portion of the exam. Then you will be escorted to the PET/CT scanner.  There, staff will get you positioned on the scanner and obtain a blood pressure and EKG.  During the exam, you will continue to be connected to the EKG and blood pressure machines.  A small, safe amount of a radioactive tracer will be injected in your IV to obtain a series of pictures of your heart along with an injection of a stress agent.    After your Exam:  It is recommended that you eat  a meal and drink a caffeinated beverage to counter act any effects of the stress agent.  Drink plenty of fluids for the remainder of the day and urinate frequently for the first couple of hours after the exam.  Your doctor will inform you of your test results within 7-10 business days.  For more information and frequently asked questions, please visit our website: [email or web address]  For questions about your test or how to prepare for your test, please call: Cardiac Imaging Nurse Navigators Office:  [phone]   Follow-Up: At [facility], you and your health needs are our priority.  As part of our continuing mission to provide you with exceptional heart care, our providers are all part of one team.  This team includes your primary Cardiologist (physician) and Advanced Practice Providers or APPs (Physician Assistants and Nurse Practitioners) who all work together to provide you with the care you need, when you need it.  Your next appointment:   3 months with Eligha Bridegroom, NP   Other Instructions

## 2024-01-29 ENCOUNTER — Other Ambulatory Visit: Payer: Self-pay

## 2024-01-29 ENCOUNTER — Encounter: Payer: Self-pay | Admitting: Family Medicine

## 2024-01-29 MED ORDER — SOLIFENACIN SUCCINATE 10 MG PO TABS: 10.0000 mg | ORAL_TABLET | Freq: Every day | ORAL | 0 refills | Status: DC

## 2024-03-14 ENCOUNTER — Telehealth (HOSPITAL_BASED_OUTPATIENT_CLINIC_OR_DEPARTMENT_OTHER): Payer: Self-pay | Admitting: Nurse Practitioner

## 2024-03-14 ENCOUNTER — Encounter (HOSPITAL_BASED_OUTPATIENT_CLINIC_OR_DEPARTMENT_OTHER): Payer: Self-pay

## 2024-03-14 NOTE — Telephone Encounter (Signed)
 Patient is following-up on his pre-approval to have his NM PET CT CAR PERF MULT ABS BF test.  Patient stated can contact [employer] at [phone]  for further information or fax# [phone] ATTN: Pre-determination Request and add Please Expedite.  Patient wants a call back to confirm approval.

## 2024-03-18 NOTE — Telephone Encounter (Signed)
 Wife is following up requesting updates. Please advise as soon as possible.

## 2024-03-18 NOTE — Telephone Encounter (Signed)
 Spoke with pt's wife, DPR and advised authorization for 03/26/24 PET scan received on 03/17/24.  Pt's wife verbalizes understanding and thanked Charity fundraiser for the callback.

## 2024-03-24 NOTE — Progress Notes (Signed)
 Cardiology Office Note:  .   Date:  03/31/2024 ID:  Juan Stone, DOB [birth year], MRN [id number] PCP: Duanne Butler DASEN, MD [facility] Providers Cardiologist:  None   Patient Profile: .      PMH Coronary artery disease CT Calcium  score 10/31/2023 CAC score 1468 (95th percentile) LM 90, LAD 780, LCx 597, RCA 1.4 Hyperlipidemia Hypothyroidism Obesity Alpha-gal allergy  OSA on CPAP  Referred to cardiology as new patient for elevated coronary artery calcium  score and seen by me on 12/26/23. He was seen with his wife. Reported 10+ year history of high cholesterol with levels that have been well-controlled on simvastatin . Has lost about 75 pounds over the past year, which he attributes to tirzepatide . No chest discomfort, shortness of breath, palpitations, orthopnea, edema, PND, presyncope, syncope.  Feels lightheaded when he first wakes up in the morning but this resolves after sitting for a few minutes.  Feeling tired quite often, but is unsure if this is related to his age or other factors. 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 No specific exercise routine. His diet is primarily chicken and fish due to an allergy  to beef and pork. Admits to consuming a significant amount of diet soda, and rare ETOH. He uses a CPAP machine for sleep apnea and reports no issues with its use. Family history significant for heart disease and hyperlipidemia in his father who got a pacemaker in his 68s but lived to be 36. His aunt had a stroke and his brother has hx of hypertension. ASCVD risk score 8.1%. Due to significantly elevated calcium  score, fatigue, and additional risk factors of hyperlipidemia we elected to get cardiac PET scan for evaluation of ischemia.      History of Present Illness: .    History of Present Illness [place] Stone is a  very pleasant 63 year old male who is here today for follow-up of coronary artery disease. We reviewed recent PET CT results which reveal a small defect with moderate reduction in uptake present in the apical to mid inferior location that appears to be reversible, with ischemia. He experiences no chest pain or significant symptoms during physical activities, except for mild shortness of breath once while hiking at 12,000 feet. Tolerating rosuvastatin  without concerning side effects. He follows a diet free of red meat and pork due to an alpha-gal allergy .  He experiences no chest pain, significant shortness of breath, or palpitations during activities. He notes slight chest tightness when sitting. He has a history of bradycardia, which remains consistent. No presyncope or syncope.    Discussed the use of AI scribe software for clinical note transcription with the patient, who gave verbal consent to proceed.  No results found for: LIPOA   ROS: See HPI       Studies Reviewed: .          Risk Assessment/Calculations:             Physical Exam:   VS: BP 120/64   Pulse 61   Ht 5' 10 (1.778 m)   Wt 195 lb (88.5 kg)   SpO2 99%   BMI 27.98 kg/m   Wt Readings from Last 3 Encounters:  03/31/24 195 lb (88.5 kg)  12/26/23 207 lb 9.6 oz (94.2 kg)  08/31/23 207 lb 8 oz (94.1 kg)     GEN: Well nourished, well  developed in no acute distress NECK: No JVD; No carotid bruits CARDIAC: RRR, no murmurs, rubs, gallops RESPIRATORY:  Clear to auscultation without rales, wheezing or rhonchi  ABDOMEN: Soft, non-tender, non-distended EXTREMITIES:  No edema; No deformity     ASSESSMENT AND PLAN: .    Assessment & Plan Coronary artery disease CT calcium  score completed 10/31/23 with CAC score of 1468 (95th percentile) with distribution of calcium  mainly in LAD and LCx.  He had nonspecific TW abnormality on EKG 12/26/23. Due to significant calcification in coronary arteries along with ASCVD risk score >  7.5%, we obtained cardiac PET CT for evaluation of ischemia. PET CT results revealed small defect with moderate reduction in uptake present in the apical to mid inferior location that appears to be reversible, consistent with ischemia. He has not had any chest pain and only experienced brief episode of shortness of breath while hiking at 12,000 feet. No palpitations, edema, orthopnea, PND.  Lengthy discussion about management of CAD.  HR and BP are well-controlled with history of sinus bradycardia, so we will not add beta blocker.  We will add amlodipine  2.5 mg daily for anti-anginal benefit.  Start aspirin  81 mg daily. Discussed symptoms that would warrant ER evaluation. Consider cardiac catheterization if symptoms worsen. Will have him see Dr. Kate for follow-up in 3 months. Has alpha-gal so he avoids red meat. Continue heart healthy mostly plant based diet avoiding saturated fat, processed foods, simple carbohydrates, and sugar along with aiming for at least 150 minutes of moderate intensity exercise each week. Notify us  if symptoms worsen prior to next appointment. Continue minoxidil , rosuvastatin .    Hypertension   Blood pressure is well controlled. Has been on minoxidil  5 mg daily for several years to also potentially left-sided hearing loss. As noted above, we will add amlodipine  2.5 mg daily. Monitor blood pressure at home and reduce minoxidil  to one tablet daily if hypotension or lightheadedness occurs.  Hyperlipidemia LDL goal < 70 Lipid panel completed 08/31/2023 with total cholesterol 156, HDL 64, triglycerides 72, and LDL-C 77.  We stopped his simvastatin  and started rosuvastatin  20 mg daily a few months ago.  He is due for NMR and LP(a) today.  Continue rosuvastatin .  OSA on CPAP: He reports compliance with CPAP along with annual follow-up. No acute concerns today.   Sinus bradycardia HR generally 50s-60s bpm. In this setting, we will not add BB for management of CAD. He is asymptomatic.             Disposition: 3 months with Dr. Kate (to establish care)  Signed, Rosaline Bane, NP-C

## 2024-03-25 ENCOUNTER — Telehealth (HOSPITAL_COMMUNITY): Payer: Self-pay | Admitting: Emergency Medicine

## 2024-03-25 NOTE — Telephone Encounter (Signed)
 Reaching out to patient to offer assistance regarding upcoming cardiac imaging study; pt verbalizes understanding of appt date/time, parking situation and where to check in, pre-test NPO status and medications ordered, and verified current allergies; name and call back number provided for further questions should they arise Rockwell Alexandria RN Navigator Cardiac Imaging [facility] [phone] office [phone] cell

## 2024-03-26 ENCOUNTER — Ambulatory Visit (HOSPITAL_COMMUNITY)
Admission: RE | Admit: 2024-03-26 | Discharge: 2024-03-26 | Disposition: A | Discharge status: Home / Self Care | Source: Ambulatory Visit | Attending: Nurse Practitioner | Admitting: Nurse Practitioner

## 2024-03-26 DIAGNOSIS — E785 Hyperlipidemia, unspecified: Secondary | ICD-10-CM | POA: Diagnosis not present

## 2024-03-26 DIAGNOSIS — I2510 Atherosclerotic heart disease of native coronary artery without angina pectoris: Secondary | ICD-10-CM | POA: Insufficient documentation

## 2024-03-26 MED ORDER — REGADENOSON 0.4 MG/5ML IV SOLN
INTRAVENOUS | Status: AC
  Filled 2024-03-26: qty 5

## 2024-03-26 MED ORDER — REGADENOSON 0.4 MG/5ML IV SOLN
0.4000 mg | Freq: Once | INTRAVENOUS | Status: AC
  Administered 2024-03-26: 0.4 mg via INTRAVENOUS

## 2024-03-26 MED ORDER — RUBIDIUM RB82 GENERATOR (RUBYFILL)
23.8200 | PACK | Freq: Once | INTRAVENOUS | Status: AC
  Administered 2024-03-26: 23.82 via INTRAVENOUS

## 2024-03-26 MED ORDER — RUBIDIUM RB82 GENERATOR (RUBYFILL)
23.6500 | PACK | Freq: Once | INTRAVENOUS | Status: AC
  Administered 2024-03-26: 23.65 via INTRAVENOUS

## 2024-03-27 LAB — NM PET CT CARDIAC PERFUSION MULTI W/ABSOLUTE BLOODFLOW
MBFR: 2.49
Nuc Rest EF: 60 %
Nuc Stress EF: 69 %
Rest MBF: 0.75 ml/g/min
Rest Nuclear Isotope Dose: 23.7 mCi
ST Depression (mm): 0 mm
Stress MBF: 1.87 ml/g/min
Stress Nuclear Isotope Dose: 23.8 mCi
TID: 0.92

## 2024-03-28 ENCOUNTER — Ambulatory Visit: Payer: Self-pay | Admitting: Nurse Practitioner

## 2024-03-28 DIAGNOSIS — E785 Hyperlipidemia, unspecified: Secondary | ICD-10-CM

## 2024-03-28 DIAGNOSIS — E7841 Elevated Lipoprotein(a): Secondary | ICD-10-CM

## 2024-03-28 DIAGNOSIS — I2510 Atherosclerotic heart disease of native coronary artery without angina pectoris: Secondary | ICD-10-CM

## 2024-03-31 ENCOUNTER — Encounter (HOSPITAL_BASED_OUTPATIENT_CLINIC_OR_DEPARTMENT_OTHER): Payer: Self-pay | Admitting: Nurse Practitioner

## 2024-03-31 ENCOUNTER — Other Ambulatory Visit (HOSPITAL_BASED_OUTPATIENT_CLINIC_OR_DEPARTMENT_OTHER): Payer: Self-pay | Admitting: *Deleted

## 2024-03-31 ENCOUNTER — Ambulatory Visit (HOSPITAL_BASED_OUTPATIENT_CLINIC_OR_DEPARTMENT_OTHER): Admitting: Nurse Practitioner

## 2024-03-31 VITALS — BP 120/64 | HR 61 | Ht 70.0 in | Wt 195.0 lb

## 2024-03-31 DIAGNOSIS — E785 Hyperlipidemia, unspecified: Secondary | ICD-10-CM | POA: Diagnosis not present

## 2024-03-31 DIAGNOSIS — G4733 Obstructive sleep apnea (adult) (pediatric): Secondary | ICD-10-CM | POA: Diagnosis not present

## 2024-03-31 DIAGNOSIS — I2510 Atherosclerotic heart disease of native coronary artery without angina pectoris: Secondary | ICD-10-CM | POA: Diagnosis not present

## 2024-03-31 DIAGNOSIS — I10 Essential (primary) hypertension: Secondary | ICD-10-CM | POA: Diagnosis not present

## 2024-03-31 DIAGNOSIS — R001 Bradycardia, unspecified: Secondary | ICD-10-CM

## 2024-03-31 DIAGNOSIS — R931 Abnormal findings on diagnostic imaging of heart and coronary circulation: Secondary | ICD-10-CM | POA: Diagnosis not present

## 2024-03-31 MED ORDER — AMLODIPINE BESYLATE 2.5 MG PO TABS: 2.5000 mg | ORAL_TABLET | Freq: Every day | ORAL | 3 refills | Status: AC

## 2024-03-31 MED ORDER — ASPIRIN 81 MG PO TBEC: 81.0000 mg | DELAYED_RELEASE_TABLET | Freq: Every day | ORAL | Status: AC

## 2024-03-31 NOTE — Patient Instructions (Addendum)
 Medication Instructions:   START Asprin one (1) tablet by mouth ( 81 mg) daily.  START Amlodipine  one (1) tablet by mouth ( 2.5 mg ) daily.   *If you need a refill on your cardiac medications before your next appointment, please call your pharmacy*  Lab Work:   TODAY!!!!! NMR/LPA/CMET  If you have labs (blood work) drawn today and your tests are completely normal, you will receive your results only by: MyChart Message (if you have MyChart) OR A paper copy in the mail If you have any lab test that is abnormal or we need to change your treatment, we will call you to review the results.  Testing/Procedures:  None ordered.   Follow-Up: At [facility], you and your health needs are our priority.  As part of our continuing mission to provide you with exceptional heart care, our providers are all part of one team.  This team includes your primary Cardiologist (physician) and Advanced Practice Providers or APPs (Physician Assistants and Nurse Practitioners) who all work together to provide you with the care you need, when you need it.  Your next appointment:   3 month(s)  Provider:   Dr. Kate    We recommend signing up for the patient portal called MyChart.  Sign up information is provided on this After Visit Summary.  MyChart is used to connect with patients for Virtual Visits (Telemedicine).  Patients are able to view lab/test results, encounter notes, upcoming appointments, etc.  Non-urgent messages can be sent to your provider as well.   To learn more about what you can do with MyChart, go to [email or web address].   Other Instructions      Mediterranean Diet  Why follow it? Research shows. Those who follow the Mediterranean diet have a reduced risk of heart disease  The diet is associated with a reduced incidence of Parkinson's and Alzheimer's diseases People following the diet may have longer life expectancies and lower rates of chronic diseases  The Dietary  Guidelines for Americans recommends the Mediterranean diet as an eating plan to promote health and prevent disease  What Is the Mediterranean Diet?  Healthy eating plan based on typical foods and recipes of Mediterranean-style cooking The diet is primarily a plant based diet; these foods should make up a majority of meals   Starches - Plant based foods should make up a majority of meals - They are an important sources of vitamins, minerals, energy, antioxidants, and fiber - Choose whole grains, foods high in fiber and minimally processed items  - Typical grain sources include wheat, oats, barley, corn, brown rice, bulgar, farro, millet, polenta, couscous  - Various types of beans include chickpeas, lentils, fava beans, black beans, white beans   Fruits  Veggies - Large quantities of antioxidant rich fruits & veggies; 6 or more servings  - Vegetables can be eaten raw or lightly drizzled with oil and cooked  - Vegetables common to the traditional Mediterranean Diet include: artichokes, arugula, beets, broccoli, brussel sprouts, cabbage, carrots, celery, collard greens, cucumbers, eggplant, kale, leeks, lemons, lettuce, mushrooms, okra, onions, peas, peppers, potatoes, pumpkin, radishes, rutabaga, shallots, spinach, sweet potatoes, turnips, zucchini - Fruits common to the Mediterranean Diet include: apples, apricots, avocados, cherries, clementines, dates, figs, grapefruits, grapes, melons, nectarines, oranges, peaches, pears, pomegranates, strawberries, tangerines  Fats - Replace butter and margarine with healthy oils, such as olive oil, canola oil, and tahini  - Limit nuts to no more than a handful a day  - Nuts include walnuts,  almonds, pecans, pistachios, pine nuts  - Limit or avoid candied, honey roasted or heavily salted nuts - Olives are central to the Mediterranean diet - can be eaten whole or used in a variety of dishes   Meats Protein - Limiting red meat: no more than a few times a  month - When eating red meat: choose lean cuts and keep the portion to the size of deck of cards - Eggs: approx. 0 to 4 times a week  - Fish and lean poultry: at least 2 a week  - Healthy protein sources include, chicken, [place], lean beef, lamb - Increase intake of seafood such as tuna, salmon, trout, mackerel, shrimp, scallops - Avoid or limit high fat processed meats such as sausage and bacon  Dairy - Include moderate amounts of low fat dairy products  - Focus on healthy dairy such as fat free yogurt, skim milk, low or reduced fat cheese - Limit dairy products higher in fat such as whole or 2% milk, cheese, ice cream  Alcohol - Moderate amounts of red wine is ok  - No more than 5 oz daily for women (all ages) and men older than age 39  - No more than 10 oz of wine daily for men younger than 77  Other - Limit sweets and other desserts  - Use herbs and spices instead of salt to flavor foods  - Herbs and spices common to the traditional Mediterranean Diet include: basil, bay leaves, chives, cloves, cumin, fennel, garlic, lavender, marjoram, mint, oregano, parsley, pepper, rosemary, sage, savory, sumac, tarragon, thyme   It's not just a diet, it's a lifestyle:  The Mediterranean diet includes lifestyle factors typical of those in the region  Foods, drinks and meals are best eaten with others and savored Daily physical activity is important for overall good health This could be strenuous exercise like running and aerobics This could also be more leisurely activities such as walking, housework, yard-work, or taking the stairs Moderation is the key; a balanced and healthy diet accommodates most foods and drinks Consider portion sizes and frequency of consumption of certain foods   Meal Ideas & Options:  Breakfast:  Whole wheat toast or whole wheat English muffins with peanut butter & hard boiled egg Steel cut oats topped with apples & cinnamon and skim milk  Fresh fruit: banana,  strawberries, melon, berries, peaches  Smoothies: strawberries, bananas, greek yogurt, peanut butter Low fat greek yogurt with blueberries and granola  Egg white omelet with spinach and mushrooms Breakfast couscous: whole wheat couscous, apricots, skim milk, cranberries  Sandwiches:  Hummus and grilled vegetables (peppers, zucchini, squash) on whole wheat bread   Grilled chicken on whole wheat pita with lettuce, tomatoes, cucumbers or tzatziki  [place] salad on whole wheat bread: tuna salad made with greek yogurt, olives, red peppers, capers, green onions Garlic rosemary lamb pita: lamb sauted with garlic, rosemary, salt & pepper; add lettuce, cucumber, greek yogurt to pita - flavor with lemon juice and black pepper  Seafood:  Mediterranean grilled salmon, seasoned with garlic, basil, parsley, lemon juice and black pepper Shrimp, lemon, and spinach whole-grain pasta salad made with low fat greek yogurt  Seared scallops with lemon orzo  Seared tuna steaks seasoned salt, pepper, coriander topped with tomato mixture of olives, tomatoes, olive oil, minced garlic, parsley, green onions and cappers  Meats:  Herbed greek chicken salad with kalamata olives, cucumber, feta  Red bell peppers stuffed with spinach, bulgur, lean ground beef (or lentils) & topped  with feta   Kebabs: skewers of chicken, tomatoes, onions, zucchini, squash  [place] burgers: made with red onions, mint, dill, lemon juice, feta cheese topped with roasted red peppers Vegetarian Cucumber salad: cucumbers, artichoke hearts, celery, red onion, feta cheese, tossed in olive oil & lemon juice  Hummus and whole grain pita points with a greek salad (lettuce, tomato, feta, olives, cucumbers, red onion) Lentil soup with celery, carrots made with vegetable broth, garlic, salt and pepper  Tabouli salad: parsley, bulgur, mint, scallions, cucumbers, tomato, radishes, lemon juice, olive oil, salt and pepper.  Adopting a Healthy Lifestyle.    Weight: Know what a healthy weight is for you (roughly BMI <25) and aim to maintain this. You can calculate your body mass index on your smart phone. Unfortunately, this is not the most accurate measure of healthy weight, but it is the simplest measurement to use. A more accurate measurement involves body scanning which measures lean muscle, fat tissue and bony density. We do not have this equipment at [facility].    Diet: Aim for 7+ servings of fruits and vegetables daily Limit animal fats in diet for cholesterol and heart health - choose grass fed whenever available Avoid highly processed foods (fast food burgers, tacos, fried chicken, pizza, hot dogs, french fries)  Saturated fat comes in the form of butter, lard, coconut oil, margarine, partially hydrogenated oils, and fat in meat. These increase your risk of cardiovascular disease.  Use healthy plant oils, such as olive, canola, soy, corn, sunflower and peanut.  Whole foods such as fruits, vegetables and whole grains have fiber  Men need > 38 grams of fiber per day Women need > 25 grams of fiber per day  Load up on vegetables and fruits - one-half of your plate: Aim for color and variety, and remember that potatoes dont count. Go for whole grains - one-quarter of your plate: Whole wheat, barley, wheat berries, quinoa, oats, brown rice, and foods made with them. If you want pasta, go with whole wheat pasta. Protein power - one-quarter of your plate: Fish, chicken, beans, and nuts are all healthy, versatile protein sources. Limit red meat. You need carbohydrates for energy! The type of carbohydrate is more important than the amount. Choose carbohydrates such as vegetables, fruits, whole grains, beans, and nuts in the place of white rice, white pasta, potatoes (baked or fried), macaroni and cheese, cakes, cookies, and donuts.  If youre thirsty, drink water. Coffee and tea are good in moderation, but skip sugary drinks and limit milk and dairy  products to one or two daily servings. Keep sugar intake at 6 teaspoons or 24 grams or LESS       Exercise: Aim for 150 min of moderate intensity exercise weekly for heart health, and weights twice weekly for bone health Stay active - any steps are better than no steps! Aim for 7-9 hours of sleep daily

## 2024-04-01 LAB — COMPREHENSIVE METABOLIC PANEL WITH GFR
ALT: 16 IU/L (ref 0–44)
AST: 22 IU/L (ref 0–40)
Albumin: 4.6 g/dL (ref 3.9–4.9)
Alkaline Phosphatase: 67 IU/L (ref 44–121)
BUN/Creatinine Ratio: 11 (ref 10–24)
BUN: 11 mg/dL (ref 8–27)
Bilirubin Total: 0.6 mg/dL (ref 0.0–1.2)
CO2: 20 mmol/L (ref 20–29)
Calcium: 9.9 mg/dL (ref 8.6–10.2)
Chloride: 99 mmol/L (ref 96–106)
Creatinine, Ser: 1.02 mg/dL (ref 0.76–1.27)
Globulin, Total: 2.3 g/dL (ref 1.5–4.5)
Glucose: 75 mg/dL (ref 70–99)
Potassium: 5.2 mmol/L (ref 3.5–5.2)
Sodium: 137 mmol/L (ref 134–144)
Total Protein: 6.9 g/dL (ref 6.0–8.5)
eGFR: 82 mL/min/1.73 (ref >59)

## 2024-04-01 LAB — NMR, LIPOPROFILE
Cholesterol, Total: 140 mg/dL (ref 100–199)
HDL Particle Number: 30.7 umol/L (ref >=30.5)
HDL-C: 68 mg/dL (ref >39)
LDL Particle Number: 424 nmol/L (ref <1000)
LDL Size: 21 nm (ref >20.5)
LDL-C (NIH Calc): 61 mg/dL (ref 0–99)
LP-IR Score: 27 (ref <=45)
Small LDL Particle Number: <90 nmol/L (ref <=527)
Triglycerides: 47 mg/dL (ref 0–149)

## 2024-04-01 LAB — LIPOPROTEIN A (LPA): Lipoprotein (a): 244.5 nmol/L — ABNORMAL HIGH (ref <75.0)

## 2024-04-03 ENCOUNTER — Telehealth: Payer: Self-pay | Admitting: Nurse Practitioner

## 2024-04-03 MED ORDER — ROSUVASTATIN CALCIUM 20 MG PO TABS: 20.0000 mg | ORAL_TABLET | Freq: Every day | ORAL | 3 refills | Status: AC

## 2024-04-03 MED ORDER — REPATHA SURECLICK 140 MG/ML ~~LOC~~ SOAJ
140.0000 mg | SUBCUTANEOUS | 3 refills | Status: DC
Start: 2024-04-03 — End: 2024-04-04

## 2024-04-03 NOTE — Telephone Encounter (Signed)
 Please complete PA for Repatha . He has elevated LP(a) and CAD. Has Nurse, learning disability.

## 2024-04-04 ENCOUNTER — Telehealth: Payer: Self-pay | Admitting: Nurse Practitioner

## 2024-04-04 ENCOUNTER — Other Ambulatory Visit (HOSPITAL_COMMUNITY): Payer: Self-pay

## 2024-04-04 ENCOUNTER — Telehealth: Payer: Self-pay | Admitting: Pharmacy Technician

## 2024-04-04 ENCOUNTER — Telehealth: Payer: Self-pay

## 2024-04-04 MED ORDER — REPATHA SURECLICK 140 MG/ML ~~LOC~~ SOAJ: 140.0000 mg | SUBCUTANEOUS | 3 refills | Status: AC

## 2024-04-04 NOTE — Telephone Encounter (Signed)
 Pharmacy Patient Advocate Encounter   Insurance verification completed.   The patient is insured through [facility]    Ran test claim for REPATHA . Currently a quantity of 2 ML is a 28 day supply and the co-pay is $90 . The current 28 day co-pay is, $90.  No PA needed at this time.   This test claim was processed through [facility]- copay amounts may vary at other pharmacies due to pharmacy/plan contracts, or as the patient moves through the different stages of their insurance plan.

## 2024-04-04 NOTE — Telephone Encounter (Signed)
 Are there coupons available for commercial patients? I feel like Juan Stone has told us  cost should only be $10/month. Thank you!

## 2024-04-04 NOTE — Telephone Encounter (Signed)
 Was asked to obtain a repatha  copay card, below is the information:

## 2024-04-04 NOTE — Telephone Encounter (Signed)
*  STAT* If patient is at the pharmacy, call can be transferred to refill team.   1. Which medications need to be refilled? (please list name of each medication and dose if known)   Evolocumab  (REPATHA  SURECLICK) 140 MG/ML SOAJ    2. Which pharmacy/location (including street and city if local pharmacy) is medication to be sent to? [facility] [phone] - [place], [place] - [address] AT [facility] [address]   3. Do they need a 30 day or 90 day supply? 90

## 2024-04-04 NOTE — Addendum Note (Signed)
 Addended by: VICCI ROXIE CROME on: 04/04/2024 05:50 PM   Modules accepted: Orders

## 2024-04-08 NOTE — Telephone Encounter (Signed)
 Rx already sent on 7/18

## 2024-04-21 DIAGNOSIS — G4733 Obstructive sleep apnea (adult) (pediatric): Secondary | ICD-10-CM | POA: Diagnosis not present

## 2024-04-25 ENCOUNTER — Other Ambulatory Visit (HOSPITAL_COMMUNITY): Payer: Self-pay

## 2024-05-02 DIAGNOSIS — M25511 Pain in right shoulder: Secondary | ICD-10-CM | POA: Diagnosis not present

## 2024-06-02 DIAGNOSIS — G4733 Obstructive sleep apnea (adult) (pediatric): Secondary | ICD-10-CM | POA: Diagnosis not present

## 2024-06-18 DIAGNOSIS — H43812 Vitreous degeneration, left eye: Secondary | ICD-10-CM | POA: Diagnosis not present

## 2024-06-18 DIAGNOSIS — Z961 Presence of intraocular lens: Secondary | ICD-10-CM | POA: Diagnosis not present

## 2024-06-18 DIAGNOSIS — Z947 Corneal transplant status: Secondary | ICD-10-CM | POA: Diagnosis not present

## 2024-06-22 NOTE — Progress Notes (Unsigned)
 Cardiology Office Note:    Date:  06/22/2024   ID:  Juan Stone, DOB 09-24-1960, MRN [id number]  PCP:  Duanne Butler DASEN, MD  Cardiologist:  None  Electrophysiologist:  None   Referring MD: Duanne Butler DASEN, MD   No chief complaint on file. ***  History of Present Illness:    Juan Stone is a 63 y.o. male with a hx of CAD, hypothyroidism, hyperlipidemia who presents for follow-up.  Initially seen by Rosaline Bane, NP for new patient evaluation on 12/26/2023.  Had been referred for elevated calcium  score (1468, 95th percentile on 10/31/2023).  Stress PET 03/2024 showed reversible apical to mid inferior perfusion defect with with mild reduction in myocardial blood flow reserve and area of perfusion defect; overall study suggested inferior ischemia and is intermediate risk.  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 Janit HOUSTON   CORNEAL TRANSPLANT     Fuch's dystorphy (bilateral)   HERNIA REPAIR N/A    [employer] 05/25/2020    Current Medications: No outpatient medications have been marked as taking for the 06/27/24 encounter (Appointment) with Kate Lonni CROME, MD.     Allergies:   Alpha-d-galactosidase and Other   Social History   Socioeconomic History   Marital status: Married    Spouse name: Not on file   Number of children: Not on file   Years of education: Not on file   Highest education level: Bachelor's degree (e.g., BA, AB, BS)  Occupational History   Not on file  Tobacco Use   Smoking status: Never    Passive exposure: Past   Smokeless tobacco: Never  Substance and Sexual Activity   Alcohol use: No    Comment: Ocassional   Drug use: No   Sexual activity: Not on  file  Other Topics Concern   Not on file  Social History Narrative   Not on file   Social Drivers of Health   Financial Resource Strain: Low Risk  (08/30/2023)   Overall Financial Resource Strain (CARDIA)    Difficulty of Paying Living Expenses: Not hard at all  Food Insecurity: No Food Insecurity (08/30/2023)   Hunger Vital Sign    Worried About Running Out of Food in the Last Year: Never true    Ran Out of Food in the Last Year: Never true  Transportation Needs: No Transportation Needs (08/30/2023)   PRAPARE - Administrator, Civil Service (Medical): No    Lack of Transportation (Non-Medical): No  Physical Activity: Insufficiently Active (08/30/2023)   Exercise Vital Sign    Days of Exercise per Week: 3 days    Minutes of Exercise per Session: 20 min  Stress: No Stress Concern Present (08/30/2023)   [employer] of Occupational Health - Occupational Stress Questionnaire    Feeling of Stress : Only a little  Social Connections: Unknown (08/30/2023)   Social Connection and Isolation Panel    Frequency of Communication with Friends and Family: Once a week    Frequency of Social Gatherings with Friends and Family: Patient declined    Attends Religious Services: Patient declined    Database administrator or Organizations: Not  on file    Attends Club or Organization Meetings: Not on file    Marital Status: Married     Family History: The patient's ***family history includes COPD in his mother; Heart disease in his father; Hyperlipidemia in his father.  ROS:   Please see the history of present illness.    *** All other systems reviewed and are negative.  EKGs/Labs/Other Studies Reviewed:    The following studies were reviewed today: ***  EKG:  EKG is *** ordered today.  The ekg ordered today demonstrates ***  Recent Labs: 08/31/2023: Hemoglobin 15.6; Platelets 274; TSH 1.89 03/31/2024: ALT 16; BUN 11; Creatinine, Ser 1.02; Potassium 5.2; Sodium 137   Recent Lipid Panel    Component Value Date/Time   CHOL 156 08/31/2023 0823   TRIG 72 08/31/2023 0823   HDL 64 08/31/2023 0823   CHOLHDL 2.4 08/31/2023 0823   VLDL 19 08/23/2016 0809   LDLCALC 77 08/31/2023 0823   LDLDIRECT 107 (H) 03/07/2019 0817    Physical Exam:    VS:  There were no vitals taken for this visit.    Wt Readings from Last 3 Encounters:  03/31/24 195 lb (88.5 kg)  12/26/23 207 lb 9.6 oz (94.2 kg)  08/31/23 207 lb 8 oz (94.1 kg)     GEN: *** Well nourished, well developed in no acute distress HEENT: Normal NECK: No JVD; No carotid bruits LYMPHATICS: No lymphadenopathy CARDIAC: ***RRR, no murmurs, rubs, gallops RESPIRATORY:  Clear to auscultation without rales, wheezing or rhonchi  ABDOMEN: Soft, non-tender, non-distended MUSCULOSKELETAL:  No edema; No deformity  SKIN: Warm and dry NEUROLOGIC:  Alert and oriented x 3 PSYCHIATRIC:  Normal affect   ASSESSMENT:    No diagnosis found. PLAN:    CAD:  Had been referred for elevated calcium  score (1468, 95th percentile on 10/31/2023).  Stress PET 03/2024 showed reversible apical to mid inferior perfusion defect with with mild reduction in myocardial blood flow reserve and area of perfusion defect; overall study suggested inferior ischemia and is intermediate risk. - Continue aspirin  81 mg daily - Continue Repatha  - Continue rosuvastatin  20 mg daily - Continue amlodipine  25 mg daily  Hyperlipidemia: On rosuvastatin  20 mg daily and Repatha   Hypertension: On minoxidil  and amlodipine   OSA: CPAP  RTC in***   Medication Adjustments/Labs and Tests Ordered: Current medicines are reviewed at length with the patient today.  Concerns regarding medicines are outlined above.  No orders of the defined types were placed in this encounter.  No orders of the defined types were placed in this encounter.   There are no Patient Instructions on file for this visit.   Signed, Lonni LITTIE Nanas, MD  06/22/2024  3:42 PM    [facility]

## 2024-06-22 NOTE — H&P (View-Only) (Signed)
 Cardiology Office Note:    Date:  06/22/2024   ID:  Juan Stone, DOB 09-24-1960, MRN [id number]  PCP:  Duanne Butler DASEN, MD  Cardiologist:  None  Electrophysiologist:  None   Referring MD: Duanne Butler DASEN, MD   No chief complaint on file. ***  History of Present Illness:    Juan Stone is a 63 y.o. male with a hx of CAD, hypothyroidism, hyperlipidemia who presents for follow-up.  Initially seen by Rosaline Bane, NP for new patient evaluation on 12/26/2023.  Had been referred for elevated calcium  score (1468, 95th percentile on 10/31/2023).  Stress PET 03/2024 showed reversible apical to mid inferior perfusion defect with with mild reduction in myocardial blood flow reserve and area of perfusion defect; overall study suggested inferior ischemia and is intermediate risk.  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 Janit HOUSTON   CORNEAL TRANSPLANT     Fuch's dystorphy (bilateral)   HERNIA REPAIR N/A    [employer] 05/25/2020    Current Medications: No outpatient medications have been marked as taking for the 06/27/24 encounter (Appointment) with Kate Lonni CROME, MD.     Allergies:   Alpha-d-galactosidase and Other   Social History   Socioeconomic History   Marital status: Married    Spouse name: Not on file   Number of children: Not on file   Years of education: Not on file   Highest education level: Bachelor's degree (e.g., BA, AB, BS)  Occupational History   Not on file  Tobacco Use   Smoking status: Never    Passive exposure: Past   Smokeless tobacco: Never  Substance and Sexual Activity   Alcohol use: No    Comment: Ocassional   Drug use: No   Sexual activity: Not on  file  Other Topics Concern   Not on file  Social History Narrative   Not on file   Social Drivers of Health   Financial Resource Strain: Low Risk  (08/30/2023)   Overall Financial Resource Strain (CARDIA)    Difficulty of Paying Living Expenses: Not hard at all  Food Insecurity: No Food Insecurity (08/30/2023)   Hunger Vital Sign    Worried About Running Out of Food in the Last Year: Never true    Ran Out of Food in the Last Year: Never true  Transportation Needs: No Transportation Needs (08/30/2023)   PRAPARE - Administrator, Civil Service (Medical): No    Lack of Transportation (Non-Medical): No  Physical Activity: Insufficiently Active (08/30/2023)   Exercise Vital Sign    Days of Exercise per Week: 3 days    Minutes of Exercise per Session: 20 min  Stress: No Stress Concern Present (08/30/2023)   [employer] of Occupational Health - Occupational Stress Questionnaire    Feeling of Stress : Only a little  Social Connections: Unknown (08/30/2023)   Social Connection and Isolation Panel    Frequency of Communication with Friends and Family: Once a week    Frequency of Social Gatherings with Friends and Family: Patient declined    Attends Religious Services: Patient declined    Database administrator or Organizations: Not  on file    Attends Club or Organization Meetings: Not on file    Marital Status: Married     Family History: The patient's ***family history includes COPD in his mother; Heart disease in his father; Hyperlipidemia in his father.  ROS:   Please see the history of present illness.    *** All other systems reviewed and are negative.  EKGs/Labs/Other Studies Reviewed:    The following studies were reviewed today: ***  EKG:  EKG is *** ordered today.  The ekg ordered today demonstrates ***  Recent Labs: 08/31/2023: Hemoglobin 15.6; Platelets 274; TSH 1.89 03/31/2024: ALT 16; BUN 11; Creatinine, Ser 1.02; Potassium 5.2; Sodium 137   Recent Lipid Panel    Component Value Date/Time   CHOL 156 08/31/2023 0823   TRIG 72 08/31/2023 0823   HDL 64 08/31/2023 0823   CHOLHDL 2.4 08/31/2023 0823   VLDL 19 08/23/2016 0809   LDLCALC 77 08/31/2023 0823   LDLDIRECT 107 (H) 03/07/2019 0817    Physical Exam:    VS:  There were no vitals taken for this visit.    Wt Readings from Last 3 Encounters:  03/31/24 195 lb (88.5 kg)  12/26/23 207 lb 9.6 oz (94.2 kg)  08/31/23 207 lb 8 oz (94.1 kg)     GEN: *** Well nourished, well developed in no acute distress HEENT: Normal NECK: No JVD; No carotid bruits LYMPHATICS: No lymphadenopathy CARDIAC: ***RRR, no murmurs, rubs, gallops RESPIRATORY:  Clear to auscultation without rales, wheezing or rhonchi  ABDOMEN: Soft, non-tender, non-distended MUSCULOSKELETAL:  No edema; No deformity  SKIN: Warm and dry NEUROLOGIC:  Alert and oriented x 3 PSYCHIATRIC:  Normal affect   ASSESSMENT:    No diagnosis found. PLAN:    CAD:  Had been referred for elevated calcium  score (1468, 95th percentile on 10/31/2023).  Stress PET 03/2024 showed reversible apical to mid inferior perfusion defect with with mild reduction in myocardial blood flow reserve and area of perfusion defect; overall study suggested inferior ischemia and is intermediate risk. - Continue aspirin  81 mg daily - Continue Repatha  - Continue rosuvastatin  20 mg daily - Continue amlodipine  25 mg daily  Hyperlipidemia: On rosuvastatin  20 mg daily and Repatha   Hypertension: On minoxidil  and amlodipine   OSA: CPAP  RTC in***   Medication Adjustments/Labs and Tests Ordered: Current medicines are reviewed at length with the patient today.  Concerns regarding medicines are outlined above.  No orders of the defined types were placed in this encounter.  No orders of the defined types were placed in this encounter.   There are no Patient Instructions on file for this visit.   Signed, Lonni LITTIE Nanas, MD  06/22/2024  3:42 PM    [facility]

## 2024-06-27 ENCOUNTER — Other Ambulatory Visit: Payer: Self-pay | Admitting: *Deleted

## 2024-06-27 ENCOUNTER — Ambulatory Visit: Attending: Cardiology | Admitting: Cardiology

## 2024-06-27 ENCOUNTER — Encounter: Payer: Self-pay | Admitting: Cardiology

## 2024-06-27 VITALS — BP 131/77 | HR 53 | Ht 70.5 in | Wt 199.2 lb

## 2024-06-27 DIAGNOSIS — I25118 Atherosclerotic heart disease of native coronary artery with other forms of angina pectoris: Secondary | ICD-10-CM | POA: Diagnosis not present

## 2024-06-27 DIAGNOSIS — E7841 Elevated Lipoprotein(a): Secondary | ICD-10-CM | POA: Diagnosis not present

## 2024-06-27 DIAGNOSIS — I10 Essential (primary) hypertension: Secondary | ICD-10-CM | POA: Diagnosis not present

## 2024-06-27 DIAGNOSIS — I2510 Atherosclerotic heart disease of native coronary artery without angina pectoris: Secondary | ICD-10-CM | POA: Diagnosis not present

## 2024-06-27 DIAGNOSIS — R079 Chest pain, unspecified: Secondary | ICD-10-CM

## 2024-06-27 DIAGNOSIS — E785 Hyperlipidemia, unspecified: Secondary | ICD-10-CM | POA: Diagnosis not present

## 2024-06-27 NOTE — Patient Instructions (Addendum)
 Medication Instructions:  Your physician recommends that you continue on your current medications as directed. Please refer to the Current Medication list given to you today.  *If you need a refill on your cardiac medications before your next appointment, please call your pharmacy*  Lab Work: Bmet, lipid , cbc today If you have labs (blood work) drawn today and your tests are completely normal, you will receive your results only by: MyChart Message (if you have MyChart) OR A paper copy in the mail If you have any lab test that is abnormal or we need to change your treatment, we will call you to review the results.  Testing/Procedures:  [facility] A DEPT OF [place] [address] A DEPT OF THE [place] [address] [place] [address] Dept: [phone] Loc: [phone]  Sesar Madewell  06/27/2024  You are scheduled for a Cardiac Catheterization on Thursday, October 16 with Dr. Ladona  1. Please arrive at the [facility] (Main Entrance A) at [facility]: [address] [place], [place] [address] at 5:30 AM (This time is 2 hour(s) before your procedure to ensure your preparation).   Free valet parking service is available. You will check in at ADMITTING. The support person will be asked to wait in the waiting room.  It is OK to have someone drop you off and come back when you are ready to be discharged.    Special note: Every effort is made to have your procedure done on time. Please understand that emergencies sometimes delay scheduled procedures.  2. Diet: Nothing to eat after midnight.   3. Hydration: You need to be well hydrated before your procedure. On October 16, you may drink approved liquids (see below) until 2 hours before the procedure, with 16 oz of water as your last intake.   List of approved liquids water, clear juice, clear tea, black coffee, fruit juices, non-citric and without pulp, carbonated  beverages, Gatorade, Kool -Aid, plain Jello-O and plain ice popsicles.  4. Labs: done today  5. Medication instructions in preparation for your procedure:   Contrast Allergy : No  On the morning of your procedure, take your Aspirin  81 mg and any morning medicines NOT listed above.  You may use sips of water.  6. Plan to go home the same day, you will only stay overnight if medically necessary. 7. Bring a current list of your medications and current insurance cards. 8. You MUST have a responsible person to drive you home. 9. Someone MUST be with you the first 24 hours after you arrive home or your discharge will be delayed. 10. Please wear clothes that are easy to get on and off and wear slip-on shoes.  Thank you for allowing us  to care for you!   -- [facility]   Follow-Up: At [facility], you and your health needs are our priority.  As part of our continuing mission to provide you with exceptional heart care, our providers are all part of one team.  This team includes your primary Cardiologist (physician) and Advanced Practice Providers or APPs (Physician Assistants and Nurse Practitioners) who all work together to provide you with the care you need, when you need it.  Your next appointment:   November 11 1:20pm  Provider:   Dr. Kate  We recommend signing up for the patient portal called MyChart.  Sign up information is provided on this After Visit Summary.  MyChart is used to connect with patients for Virtual Visits (Telemedicine).  Patients are able to view lab/test results, encounter notes, upcoming appointments, etc.  Non-urgent messages can be sent to your provider as well.   To learn more about what you can do with MyChart, go to [email or web address].   Other Instructions none

## 2024-06-28 LAB — NMR, LIPOPROFILE
Cholesterol, Total: 107 mg/dL (ref 100–199)
HDL Particle Number: 32.1 umol/L (ref >=30.5)
HDL-C: 71 mg/dL (ref >39)
LDL Particle Number: <300 nmol/L (ref <1000)
LDL-C (NIH Calc): 25 mg/dL (ref 0–99)
LP-IR Score: <25 (ref <=45)
Small LDL Particle Number: <90 nmol/L (ref <=527)
Triglycerides: 41 mg/dL (ref 0–149)

## 2024-07-01 ENCOUNTER — Telehealth: Payer: Self-pay | Admitting: *Deleted

## 2024-07-01 DIAGNOSIS — I25118 Atherosclerotic heart disease of native coronary artery with other forms of angina pectoris: Secondary | ICD-10-CM

## 2024-07-01 DIAGNOSIS — Z01812 Encounter for preprocedural laboratory examination: Secondary | ICD-10-CM

## 2024-07-01 LAB — LIPID PANEL

## 2024-07-01 NOTE — Telephone Encounter (Signed)
 Cardiac Catheterization scheduled at [facility] for: Thursday July 03, 2024 7:30 AM Arrival time [facility] Main Entrance A at: 5:30 AM  Diet: -Nothing to eat after midnight.  Hydration: -May drink clear liquids until 2 hours before the procedure.  Approved liquids: Water, clear tea, black coffee, fruit juices-non-citric and without pulp,Gatorade, plain Jello/popsicles.   -Please drink 16  oz of water 2 hours before procedure.  Medication instructions:  -Usual morning medications can be taken including aspirin  81 mg.   Patient tells me Wegovy  is weekly on Sundays-he did not take last Sunday  Plan to go home the same day, you will only stay overnight if medically necessary.  You must have responsible adult to drive you home.  Someone must be with you the first 24 hours after you arrive home.  Reviewed procedure instructions with patient.

## 2024-07-01 NOTE — Telephone Encounter (Signed)
 Patient tells me he had blood drawn 06/27/24, but I do not see results for BMP/CBC/Lipid panel. I called [facility] Customer Service-they do not have results for lipid panel/CBC/BMP ordered 06/27/24. Pt will go to [facility] today for BMP/CBC/Lipid panel-he tells me he has not eaten since last night.

## 2024-07-02 ENCOUNTER — Ambulatory Visit: Payer: Self-pay | Admitting: Cardiology

## 2024-07-02 DIAGNOSIS — E785 Hyperlipidemia, unspecified: Secondary | ICD-10-CM

## 2024-07-02 LAB — BASIC METABOLIC PANEL WITH GFR
BUN/Creatinine Ratio: 13 (ref 10–24)
BUN: 12 mg/dL (ref 8–27)
CO2: 19 mmol/L — AB (ref 20–29)
Calcium: 9.9 mg/dL (ref 8.6–10.2)
Chloride: 100 mmol/L (ref 96–106)
Creatinine, Ser: 0.91 mg/dL (ref 0.76–1.27)
Glucose: 84 mg/dL (ref 70–99)
Potassium: 5.2 mmol/L (ref 3.5–5.2)
Sodium: 139 mmol/L (ref 134–144)
eGFR: 94 mL/min/1.73 (ref >59)

## 2024-07-02 LAB — LIPID PANEL
Cholesterol, Total: 108 mg/dL (ref 100–199)
HDL: 72 mg/dL (ref >39)
LDL CALC COMMENT:: 1.5 ratio (ref 0.0–5.0)
LDL Chol Calc (NIH): 24 mg/dL (ref 0–99)
Triglycerides: 48 mg/dL (ref 0–149)
VLDL Cholesterol Cal: 12 mg/dL (ref 5–40)

## 2024-07-02 LAB — CBC
Hematocrit: 44.7 % (ref 37.5–51.0)
Hemoglobin: 14.9 g/dL (ref 13.0–17.7)
MCH: 32 pg (ref 26.6–33.0)
MCHC: 33.3 g/dL (ref 31.5–35.7)
MCV: 96 fL (ref 79–97)
Platelets: 227 x10E3/uL (ref 150–450)
RBC: 4.66 x10E6/uL (ref 4.14–5.80)
RDW: 12.1 % (ref 11.6–15.4)
WBC: 4.9 x10E3/uL (ref 3.4–10.8)

## 2024-07-03 ENCOUNTER — Encounter (HOSPITAL_COMMUNITY): Admission: RE | Disposition: A | Payer: Self-pay | Source: Home / Self Care | Attending: Cardiology

## 2024-07-03 ENCOUNTER — Ambulatory Visit (HOSPITAL_COMMUNITY)
Admission: RE | Admit: 2024-07-03 | Discharge: 2024-07-03 | Disposition: A | Discharge status: Home / Self Care | Attending: Cardiology | Admitting: Cardiology

## 2024-07-03 ENCOUNTER — Encounter (HOSPITAL_COMMUNITY): Payer: Self-pay | Admitting: Cardiology

## 2024-07-03 ENCOUNTER — Other Ambulatory Visit: Payer: Self-pay

## 2024-07-03 DIAGNOSIS — E7800 Pure hypercholesterolemia, unspecified: Secondary | ICD-10-CM | POA: Insufficient documentation

## 2024-07-03 DIAGNOSIS — E039 Hypothyroidism, unspecified: Secondary | ICD-10-CM | POA: Insufficient documentation

## 2024-07-03 DIAGNOSIS — I25118 Atherosclerotic heart disease of native coronary artery with other forms of angina pectoris: Secondary | ICD-10-CM | POA: Diagnosis not present

## 2024-07-03 DIAGNOSIS — G4733 Obstructive sleep apnea (adult) (pediatric): Secondary | ICD-10-CM | POA: Insufficient documentation

## 2024-07-03 DIAGNOSIS — Z79899 Other long term (current) drug therapy: Secondary | ICD-10-CM | POA: Diagnosis not present

## 2024-07-03 DIAGNOSIS — Z79890 Hormone replacement therapy (postmenopausal): Secondary | ICD-10-CM | POA: Insufficient documentation

## 2024-07-03 DIAGNOSIS — I2510 Atherosclerotic heart disease of native coronary artery without angina pectoris: Secondary | ICD-10-CM | POA: Diagnosis present

## 2024-07-03 DIAGNOSIS — I2584 Coronary atherosclerosis due to calcified coronary lesion: Secondary | ICD-10-CM | POA: Insufficient documentation

## 2024-07-03 DIAGNOSIS — Z7982 Long term (current) use of aspirin: Secondary | ICD-10-CM | POA: Diagnosis not present

## 2024-07-03 DIAGNOSIS — I10 Essential (primary) hypertension: Secondary | ICD-10-CM | POA: Insufficient documentation

## 2024-07-03 HISTORY — PX: LEFT HEART CATH AND CORONARY ANGIOGRAPHY: CATH118249

## 2024-07-03 SURGERY — LEFT HEART CATH AND CORONARY ANGIOGRAPHY
Anesthesia: LOCAL

## 2024-07-03 MED ORDER — FREE WATER: 500.0000 mL | Freq: Once | Status: DC

## 2024-07-03 MED ORDER — LIDOCAINE HCL (PF) 1 % IJ SOLN
INTRAMUSCULAR | Status: DC | PRN
  Administered 2024-07-03: 2 mL via INTRADERMAL

## 2024-07-03 MED ORDER — ASPIRIN 81 MG PO CHEW: 81.0000 mg | CHEWABLE_TABLET | ORAL | Status: AC

## 2024-07-03 MED ORDER — MIDAZOLAM HCL 2 MG/2ML IJ SOLN
INTRAMUSCULAR | Status: AC
  Filled 2024-07-03: qty 2

## 2024-07-03 MED ORDER — VERAPAMIL HCL 2.5 MG/ML IV SOLN
INTRAVENOUS | Status: DC | PRN
  Administered 2024-07-03: 10 mL via INTRA_ARTERIAL

## 2024-07-03 MED ORDER — IOHEXOL 350 MG/ML SOLN
INTRAVENOUS | Status: DC | PRN
Start: 2024-07-03 — End: 2024-07-03
  Administered 2024-07-03: 46 mL

## 2024-07-03 MED ORDER — SODIUM CHLORIDE 0.9% FLUSH: 3.0000 mL | Freq: Two times a day (BID) | INTRAVENOUS | Status: DC

## 2024-07-03 MED ORDER — MIDAZOLAM HCL 2 MG/2ML IJ SOLN
INTRAMUSCULAR | Status: DC | PRN
Start: 2024-07-03 — End: 2024-07-03
  Administered 2024-07-03: 1 mg via INTRAVENOUS

## 2024-07-03 MED ORDER — FENTANYL CITRATE (PF) 100 MCG/2ML IJ SOLN
INTRAMUSCULAR | Status: AC
  Filled 2024-07-03: qty 2

## 2024-07-03 MED ORDER — SODIUM CHLORIDE 0.9 % IV SOLN: 250.0000 mL | INTRAVENOUS | Status: DC | PRN

## 2024-07-03 MED ORDER — SODIUM CHLORIDE 0.9 % IV SOLN
INTRAVENOUS | Status: DC | PRN
  Administered 2024-07-03: 1000 mL

## 2024-07-03 MED ORDER — VERAPAMIL HCL 2.5 MG/ML IV SOLN
INTRAVENOUS | Status: AC
  Filled 2024-07-03: qty 2

## 2024-07-03 MED ORDER — SODIUM CHLORIDE 0.9% FLUSH: 3.0000 mL | INTRAVENOUS | Status: DC | PRN

## 2024-07-03 MED ORDER — LIDOCAINE HCL (PF) 1 % IJ SOLN
INTRAMUSCULAR | Status: AC
  Filled 2024-07-03: qty 30

## 2024-07-03 MED ORDER — FENTANYL CITRATE (PF) 100 MCG/2ML IJ SOLN
INTRAMUSCULAR | Status: DC | PRN
  Administered 2024-07-03: 50 ug via INTRAVENOUS

## 2024-07-03 SURGICAL SUPPLY — 7 items
CATH 5FR JL3.5 JR4 ANG PIG MP (CATHETERS) IMPLANT
DEVICE RAD COMP TR BAND LRG (VASCULAR PRODUCTS) IMPLANT
GLIDESHEATH SLEND SS 6F .021 (SHEATH) IMPLANT
GUIDEWIRE INQWIRE 1.5J.035X260 (WIRE) IMPLANT
KIT SYRINGE INJ CVI SPIKEX1 (MISCELLANEOUS) IMPLANT
PACK CARDIAC CATHETERIZATION (CUSTOM PROCEDURE TRAY) ×1 IMPLANT
SET ATX-X65L (MISCELLANEOUS) IMPLANT

## 2024-07-03 NOTE — Interval H&P Note (Signed)
 History and Physical Interval Note:  07/03/2024 7:39 AM  Juan Stone  has presented today for surgery, with the diagnosis of CP.  The various methods of treatment have been discussed with the patient and family. After consideration of risks, benefits and other options for treatment, the patient has consented to  Procedure(s): LEFT HEART CATH AND CORONARY ANGIOGRAPHY (N/A) and possible coronary angioplasty  for class 3 angina pectoris as a surgical intervention.  The patient's history has been reviewed, patient examined, no change in status, stable for surgery.  I have reviewed the patient's chart and labs.  Questions were answered to the patient's satisfaction.     Gordy Bergamo

## 2024-07-03 NOTE — Progress Notes (Signed)
 TR Band removed. No s/s of complications at the incision site. PT ambulated in the hallway. Was able to void int he bathroom without difficulty. Discharge instructions reviewed with patient and wife little at the bedside. Denies questions or concerns. PT escorted from the unit via wheel chair to personal vehicle.

## 2024-07-03 NOTE — Discharge Instructions (Signed)

## 2024-07-10 NOTE — Telephone Encounter (Signed)
 Called and made spouse aware that order had been placed for LPa. Per spouse, Little she will make patient aware. Per spouse pt has upcoming visit with provider and lab work was not done with previous lab work and could not be added. Order placed.

## 2024-07-10 NOTE — Addendum Note (Signed)
 Addended by: TRUDY FRANKEY SAUNDERS on: 07/10/2024 05:06 PM   Modules accepted: Orders

## 2024-07-11 ENCOUNTER — Other Ambulatory Visit: Payer: Self-pay | Admitting: *Deleted

## 2024-07-11 ENCOUNTER — Telehealth: Payer: Self-pay | Admitting: *Deleted

## 2024-07-11 DIAGNOSIS — E785 Hyperlipidemia, unspecified: Secondary | ICD-10-CM | POA: Diagnosis not present

## 2024-07-11 NOTE — Telephone Encounter (Signed)
 Called patient

## 2024-07-11 NOTE — Telephone Encounter (Signed)
 Called patient and made him aware that order for Lipoprotein A had been placed and pt verbalized he going to lab to have blood work done. Made pt aware to call office for any questions. Understanding verbalized.

## 2024-07-14 LAB — LIPOPROTEIN A (LPA): Lipoprotein (a): 217.7 nmol/L — ABNORMAL HIGH (ref <75.0)

## 2024-07-15 ENCOUNTER — Ambulatory Visit: Payer: Self-pay | Admitting: Cardiology

## 2024-07-24 LAB — LIPOPROTEIN A (LPA): Lipoprotein (a): 230.8 nmol/L — ABNORMAL HIGH (ref <75.0)

## 2024-07-24 LAB — SPECIMEN STATUS REPORT

## 2024-07-27 NOTE — Progress Notes (Unsigned)
 Cardiology Office Note:    Date:  07/27/2024   ID:  Juan Stone, DOB 1961/06/19, MRN [id number]  PCP:  Duanne Butler DASEN, MD  Cardiologist:  None  Electrophysiologist:  None   Referring MD: Duanne Butler DASEN, MD   No chief complaint on file.   History of Present Illness:    Juan Stone is a 63 y.o. male with a hx of CAD, hypothyroidism, hyperlipidemia who presents for follow-up.  Initially seen by Rosaline Bane, NP for new patient evaluation on 12/26/2023.  Had been referred for elevated calcium  score (1468, 95th percentile on 10/31/2023).  Stress PET 03/2024 showed reversible apical to mid inferior perfusion defect with with mild reduction in myocardial blood flow reserve and area of perfusion defect; overall study suggested inferior ischemia and is intermediate risk.  LHC 07/03/2024 showed nonobstructive CAD (30% proximal LAD, 30% proximal LCx, 30% OM).  Since last clinic visit,  he reports he is doing okay.  Does report he has been having some chest tightness.  Seems to correspond to when he is feeling anxious.  He goes to the [facility]: [facility] [address] and does weights and cardio, denies any dyspnea.  Does report chest tightness can occur with exertion but does not notice particular correlation, can also occur at rest.  He has lost 85 pounds over the last year.  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 [facility]   CORNEAL TRANSPLANT     Fuch's dystorphy (bilateral)   HERNIA REPAIR N/A    [employer] 05/25/2020   LEFT HEART CATH AND CORONARY ANGIOGRAPHY N/A 07/03/2024   Procedure: LEFT HEART CATH AND CORONARY ANGIOGRAPHY;  Surgeon: Ladona Heinz, MD;  Location: [facility] INVASIVE CV LAB;  Service: Cardiovascular;  Laterality: N/A;    Current  Medications: No outpatient medications have been marked as taking for the 07/29/24 encounter (Appointment) with Kate Lonni CROME, MD.     Allergies:   Alpha-d-galactosidase, Bovine (beef) protein-containing drug products, and Porcine (pork) protein-containing drug products   Social History   Socioeconomic History   Marital status: Married    Spouse name: Not on file   Number of children: Not on file   Years of education: Not on file   Highest education level: Bachelor's degree (e.g., BA, AB, BS)  Occupational History   Not on file  Tobacco Use   Smoking status: Never    Passive exposure: Past   Smokeless tobacco: Never  Substance and Sexual Activity   Alcohol use: No    Comment: Ocassional   Drug use: No   Sexual activity: Not on file  Other Topics Concern   Not on file  Social History Narrative   Not on file   Social Drivers of Health   Financial Resource Strain: Low Risk  (08/30/2023)   Overall Financial Resource Strain (CARDIA)    Difficulty of Paying Living Expenses: Not hard at all  Food Insecurity: No Food Insecurity (08/30/2023)   Hunger Vital Sign    Worried About Running Out of Food in the Last Year: Never true    Ran Out of Food in the Last Year: Never true  Transportation Needs: No Transportation Needs (08/30/2023)   PRAPARE - Transportation  Lack of Transportation (Medical): No    Lack of Transportation (Non-Medical): No  Physical Activity: Insufficiently Active (08/30/2023)   Exercise Vital Sign    Days of Exercise per Week: 3 days    Minutes of Exercise per Session: 20 min  Stress: No Stress Concern Present (08/30/2023)   [employer] of Occupational Health - Occupational Stress Questionnaire    Feeling of Stress : Only a little  Social Connections: Unknown (08/30/2023)   Social Connection and Isolation Panel    Frequency of Communication with Friends and Family: Once a week    Frequency of Social Gatherings with Friends and Family:  Patient declined    Attends Religious Services: Patient declined    Database Administrator or Organizations: Not on file    Attends Banker Meetings: Not on file    Marital Status: Married     Family History: The patient's family history includes COPD in his mother; Heart disease in his father; Hyperlipidemia in his father.  ROS:   Please see the history of present illness.     All other systems reviewed and are negative.  EKGs/Labs/Other Studies Reviewed:    The following studies were reviewed today:   EKG:   06/27/24: Sinus bradycardia, rate 57, nonspecific T wave flattening  Recent Labs: 08/31/2023: TSH 1.89 03/31/2024: ALT 16 07/01/2024: BUN 12; Creatinine, Ser 0.91; Hemoglobin 14.9; Platelets 227; Potassium 5.2; Sodium 139  Recent Lipid Panel    Component Value Date/Time   CHOL 108 07/01/2024 1008   TRIG 48 07/01/2024 1008   HDL 72 07/01/2024 1008   CHOLHDL 1.5 07/01/2024 1008   CHOLHDL 2.4 08/31/2023 0823   VLDL 19 08/23/2016 0809   LDLCALC 24 07/01/2024 1008   LDLCALC 77 08/31/2023 0823   LDLDIRECT 107 (H) 03/07/2019 0817    Physical Exam:    VS:  There were no vitals taken for this visit.    Wt Readings from Last 3 Encounters:  07/03/24 197 lb (89.4 kg)  06/27/24 199 lb 3.2 oz (90.4 kg)  03/31/24 195 lb (88.5 kg)     GEN:  Well nourished, well developed in no acute distress HEENT: Normal NECK: No JVD; No carotid bruits LYMPHATICS: No lymphadenopathy CARDIAC: RRR, no murmurs, rubs, gallops RESPIRATORY:  Clear to auscultation without rales, wheezing or rhonchi  ABDOMEN: Soft, non-tender, non-distended MUSCULOSKELETAL:  No edema; No deformity  SKIN: Warm and dry NEUROLOGIC:  Alert and oriented x 3 PSYCHIATRIC:  Normal affect   ASSESSMENT:    No diagnosis found.  PLAN:    CAD:  Had been referred for elevated calcium  score (1468, 95th percentile on 10/31/2023).  Stress PET 03/2024 showed reversible apical to mid inferior perfusion  defect with with mild reduction in myocardial blood flow reserve and area of perfusion defect; overall study suggested inferior ischemia and is intermediate risk.  LHC 07/03/2024 showed nonobstructive CAD (30% proximal LAD, 30% proximal LCx, 30% OM). - Continue aspirin  81 mg daily - Continue Repatha  - Continue rosuvastatin  20 mg daily - Continue amlodipine  2.5 mg daily  Hyperlipidemia: On rosuvastatin  20 mg daily and Repatha .  LDL 24 on 07/01/2024.  LP(a) 245 03/2024, improved to 218 with starting Repatha  on 07/01/2024  Hypertension: On minoxidil  and amlodipine .  Appears controlled  OSA: on CPAP  RTC in 1 month***   Medication Adjustments/Labs and Tests Ordered: Current medicines are reviewed at length with the patient today.  Concerns regarding medicines are outlined above.  No orders of the defined types were placed  in this encounter.  No orders of the defined types were placed in this encounter.   There are no Patient Instructions on file for this visit.   Signed, Lonni LITTIE Nanas, MD  07/27/2024 10:11 PM    [facility]

## 2024-07-28 ENCOUNTER — Other Ambulatory Visit: Payer: Self-pay

## 2024-07-28 ENCOUNTER — Telehealth: Payer: Self-pay

## 2024-07-28 MED ORDER — SOLIFENACIN SUCCINATE 10 MG PO TABS: 10.0000 mg | ORAL_TABLET | Freq: Every day | ORAL | 0 refills | Status: DC

## 2024-07-28 NOTE — Telephone Encounter (Signed)
 Sent in medication

## 2024-07-28 NOTE — Telephone Encounter (Signed)
 Prescription Request  07/28/2024  LOV: 08/31/23  What is the name of the medication or equipment? solifenacin  (VESICARE ) 10 MG tablet [[id number]]   Have you contacted your pharmacy to request a refill? Yes   Which pharmacy would you like this sent to?  [facility] - [place], [place] - [facility] AT Portal to Registered [facility] Sites [address] [place] [address] Phone: [phone] Fax: [phone]    Patient notified that their request is being sent to the clinical staff for review and that they should receive a response within 2 business days.   Please advise at [facility] [phone]

## 2024-07-29 ENCOUNTER — Ambulatory Visit: Attending: Cardiology | Admitting: Cardiology

## 2024-07-29 ENCOUNTER — Encounter: Payer: Self-pay | Admitting: Cardiology

## 2024-07-29 ENCOUNTER — Telehealth: Payer: Self-pay | Admitting: Cardiology

## 2024-07-29 VITALS — BP 104/68 | HR 61 | Ht 70.5 in | Wt 201.0 lb

## 2024-07-29 DIAGNOSIS — I2510 Atherosclerotic heart disease of native coronary artery without angina pectoris: Secondary | ICD-10-CM | POA: Diagnosis not present

## 2024-07-29 DIAGNOSIS — I10 Essential (primary) hypertension: Secondary | ICD-10-CM | POA: Diagnosis not present

## 2024-07-29 DIAGNOSIS — E785 Hyperlipidemia, unspecified: Secondary | ICD-10-CM

## 2024-07-29 NOTE — Telephone Encounter (Signed)
 Spoke with patient's wife about DOS 03/26/24 when patient had a NM PET CT. Ms Little told me how she has been going back and forth with both [facility] Billing and [employer] about the charges for this date of service.   In particular she is questioning the charge of $117 for CPT code: 21565 that was not accepted by insurance for payment and has instead been billed to the patient. She is letting us  know that this claim has been put under review per [employer], and Romero from our [facility] team is also aware of this and has been trying to help the patient with this. After looking into the referral and patients account looks like the patient was approved for CPT: [phone]?  Can someone please help her out with directions on what she needs to do or is there anything we can do to help the patient? Patient's wife has talked about how confusing all of this has been for them and how frustrating it is to be in the middle of all this.   Account # [id number]

## 2024-07-29 NOTE — Patient Instructions (Signed)
 Medication Instructions:  Your physician recommends that you continue on your current medications as directed. Please refer to the Current Medication list given to you today.   *If you need a refill on your cardiac medications before your next appointment, please call your pharmacy*  Lab Work: None  If you have labs (blood work) drawn today and your tests are completely normal, you will receive your results only by: MyChart Message (if you have MyChart) OR A paper copy in the mail If you have any lab test that is abnormal or we need to change your treatment, we will call you to review the results.  Testing/Procedures: None   Follow-Up: At [facility], you and your health needs are our priority.  As part of our continuing mission to provide you with exceptional heart care, our providers are all part of one team.  This team includes your primary Cardiologist (physician) and Advanced Practice Providers or APPs (Physician Assistants and Nurse Practitioners) who all work together to provide you with the care you need, when you need it.  Your next appointment:   6 month(s)  Provider:   Dr. Lonni Nanas   Other Instructions None

## 2024-07-30 NOTE — Telephone Encounter (Signed)
 I returned call to Mrs Hoheisel at the home number. Mr. Hjort advised she was out and gave me her cell #. I had to leave a vm advising I would attempt to call her back in about 30 minutes.

## 2024-07-30 NOTE — Telephone Encounter (Signed)
 Talked with Juan Stone and resolved her questions. Advised if she has questions regarding the hospital balance, she should contact them.

## 2024-08-11 ENCOUNTER — Encounter: Payer: Self-pay | Admitting: Family Medicine

## 2024-08-11 ENCOUNTER — Other Ambulatory Visit: Payer: Self-pay

## 2024-08-11 DIAGNOSIS — Z91018 Allergy to other foods: Secondary | ICD-10-CM

## 2024-08-11 MED ORDER — EPINEPHRINE 0.3 MG/0.3ML IJ SOAJ: 0.3000 mg | INTRAMUSCULAR | 2 refills | Status: AC | PRN

## 2024-08-19 ENCOUNTER — Other Ambulatory Visit: Payer: Self-pay | Admitting: Family Medicine

## 2024-08-19 DIAGNOSIS — E039 Hypothyroidism, unspecified: Secondary | ICD-10-CM

## 2024-08-19 NOTE — Telephone Encounter (Signed)
 Copied from CRM #[id number]. Topic: Clinical - Medication Refill >> Aug 19, 2024  8:37 AM Amber H wrote: Medication: levothyroxine  (SYNTHROID ) 175 MCG tablet [[id number]]  Has the patient contacted their pharmacy? No- pharmacy called.  (Agent: If no, request that the patient contact the pharmacy for the refill. If patient does not wish to contact the pharmacy document the reason why and proceed with request.) (Agent: If yes, when and what did the pharmacy advise?)  This is the patient's preferred pharmacy:  [facility] - [place], [place] - [facility] AT Portal to Registered Caremark Sites [address] [place] [place] [address] Phone: [phone] Fax: [phone]   Is this the correct pharmacy for this prescription? Yes If no, delete pharmacy and type the correct one.   Has the prescription been filled recently? Yes  Is the patient out of the medication? No, almost out   Has the patient been seen for an appointment in the last year OR does the patient have an upcoming appointment? No- I did advise Clinton (the pharmacist) that patient has not seen provider in the past year and that an appt may be required for the refill. I did see that he has an upcoming physical on 09/01/2024 with the provider.   Can we respond through MyChart? No- pharmacy on the call stated we could call back at [phone] opt [phone] reference number- [phone]   Agent: Please be advised that Rx refills may take up to 3 business days. We ask that you follow-up with your pharmacy.

## 2024-08-25 MED ORDER — LEVOTHYROXINE SODIUM 175 MCG PO TABS: 175.0000 ug | ORAL_TABLET | Freq: Every day | ORAL | 3 refills | Status: DC

## 2024-08-27 DIAGNOSIS — G4733 Obstructive sleep apnea (adult) (pediatric): Secondary | ICD-10-CM | POA: Diagnosis not present

## 2024-09-01 ENCOUNTER — Encounter: Payer: Self-pay | Admitting: Family Medicine

## 2024-09-01 ENCOUNTER — Ambulatory Visit (INDEPENDENT_AMBULATORY_CARE_PROVIDER_SITE_OTHER): Admitting: Family Medicine

## 2024-09-01 VITALS — BP 112/80 | HR 60 | Temp 97.7°F | Ht 70.0 in | Wt 200.4 lb

## 2024-09-01 DIAGNOSIS — E039 Hypothyroidism, unspecified: Secondary | ICD-10-CM | POA: Diagnosis not present

## 2024-09-01 DIAGNOSIS — E7800 Pure hypercholesterolemia, unspecified: Secondary | ICD-10-CM

## 2024-09-01 DIAGNOSIS — Z23 Encounter for immunization: Secondary | ICD-10-CM

## 2024-09-01 DIAGNOSIS — Z0001 Encounter for general adult medical examination with abnormal findings: Secondary | ICD-10-CM | POA: Diagnosis not present

## 2024-09-01 DIAGNOSIS — Z0000 Encounter for general adult medical examination without abnormal findings: Secondary | ICD-10-CM

## 2024-09-01 DIAGNOSIS — I2510 Atherosclerotic heart disease of native coronary artery without angina pectoris: Secondary | ICD-10-CM

## 2024-09-01 DIAGNOSIS — Z91018 Allergy to other foods: Secondary | ICD-10-CM

## 2024-09-01 DIAGNOSIS — Z125 Encounter for screening for malignant neoplasm of prostate: Secondary | ICD-10-CM

## 2024-09-01 MED ORDER — TRAZODONE HCL 50 MG PO TABS: 50.0000 mg | ORAL_TABLET | Freq: Every evening | ORAL | 3 refills | Status: AC | PRN

## 2024-09-01 MED ORDER — MINOXIDIL 2.5 MG PO TABS: 2.5000 mg | ORAL_TABLET | Freq: Every day | ORAL | 3 refills | Status: AC

## 2024-09-01 MED ORDER — SOLIFENACIN SUCCINATE 10 MG PO TABS: 10.0000 mg | ORAL_TABLET | Freq: Every day | ORAL | 1 refills | Status: AC

## 2024-09-01 NOTE — Progress Notes (Signed)
 Subjective:    Patient ID: Juan Stone, male    DOB: 09-Oct-1960, 63 y.o.   MRN: [id number]  HPI Patient is a 63 year old white male here today for complete physical exam.  Since I last saw the patient, he had an elevated coronary artery calcium  score.  He underwent catheterization was found to have a 30% blockage in his LAD and a 30% blockage in his obtuse marginal.  He is currently on Repatha  as well as rosuvastatin .  His most recent LDL cholesterol was less than 55 in October.  He does have elevated lipoprotein a levels.  He would like to repeat that today.  He also has a history of alpha gal allergy .  He would like to repeat these levels to see if he can start trying to reintroduce these foods in his diet.  He is due to recheck his TSH.  His immunizations are up-to-date except for his tetanus shot which is due.  He had his flu, pneumonia, and shingles vaccine at another office.  Patient successfully lost over 80 pounds on Wegovy .  He is doing well.  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 [address]   CORNEAL TRANSPLANT     Fuch's dystorphy (bilateral)   HERNIA REPAIR N/A    [employer] 05/25/2020   LEFT HEART CATH AND CORONARY ANGIOGRAPHY N/A 07/03/2024   Procedure: LEFT HEART CATH AND CORONARY ANGIOGRAPHY;  Surgeon: Ladona Heinz, MD;  Location: [facility];  Service: Cardiovascular;  Laterality: N/A;   Current Outpatient Medications on File Prior to Visit  Medication Sig Dispense Refill   amLODipine  (NORVASC ) 2.5 MG tablet Take 1 tablet (2.5 mg total) by mouth daily. 90 tablet 3   aspirin  EC 81 MG tablet Take 1 tablet (81 mg total) by mouth daily. Swallow whole.     EPINEPHrine  0.3 mg/0.3 mL IJ SOAJ injection Inject 0.3 mg  into the muscle as needed for anaphylaxis. 1 each 2   Evolocumab  (REPATHA  SURECLICK) 140 MG/ML SOAJ Inject 140 mg into the skin every 14 (fourteen) days. 6 mL 3   fluorometholone  (FML) 0.1 % ophthalmic suspension Place 1 drop into both eyes daily.     fluticasone  (FLONASE ) 50 MCG/ACT nasal spray Place 2 sprays into both nostrils daily as needed for allergies or rhinitis. 16 g 11   Glycerin-Hypromellose-PEG 400 (DRY EYE RELIEF DROPS OP) Place 1 drop into both eyes daily as needed (Dry eyes). Bausch and [employer] advanced eye relief     levothyroxine  (SYNTHROID ) 175 MCG tablet Take 1 tablet (175 mcg total) by mouth daily. 90 tablet 3   MAGNESIUM GLYCINATE PO Take 240 mg by mouth daily.     Multiple Vitamins tablet Take 1 tablet by mouth daily.     rosuvastatin  (CRESTOR ) 20 MG tablet Take 1 tablet (20 mg total) by mouth daily. 90 tablet 3   semaglutide -weight management (WEGOVY ) 1 MG/0.5ML SOAJ SQ injection Inject 1 mg into the skin once a week. Sunday (Patient taking differently: Inject 1.7 mg into the skin once a week. Sunday)     No current facility-administered medications on file prior to visit.   Allergies  Allergen Reactions   Alpha-D-Galactosidase Hives, Diarrhea and  Nausea And Vomiting   Bovine (Beef) Protein-Containing Drug Products Hives, Nausea And Vomiting and Swelling   Porcine (Pork) Protein-Containing Drug Products Hives, Nausea And Vomiting and Swelling   Social History   Socioeconomic History   Marital status: Married    Spouse name: Not on file   Number of children: Not on file   Years of education: Not on file   Highest education level: Bachelor's degree (e.g., BA, AB, BS)  Occupational History   Not on file  Tobacco Use   Smoking status: Never    Passive exposure: Past   Smokeless tobacco: Never  Substance and Sexual Activity   Alcohol use: No    Comment: Ocassional   Drug use: No   Sexual activity: Not on file  Other Topics Concern   Not on file  Social History  Narrative   Not on file   Social Drivers of Health   Tobacco Use: Low Risk (07/29/2024)   Patient History    Smoking Tobacco Use: Never    Smokeless Tobacco Use: Never    Passive Exposure: Past  Financial Resource Strain: Low Risk (08/28/2024)   Overall Financial Resource Strain (CARDIA)    Difficulty of Paying Living Expenses: Not hard at all  Food Insecurity: No Food Insecurity (08/28/2024)   Epic    Worried About Radiation Protection Practitioner of Food in the Last Year: Never true    Ran Out of Food in the Last Year: Never true  Transportation Needs: No Transportation Needs (08/28/2024)   Epic    Lack of Transportation (Medical): No    Lack of Transportation (Non-Medical): No  Physical Activity: Insufficiently Active (08/28/2024)   Exercise Vital Sign    Days of Exercise per Week: 2 days    Minutes of Exercise per Session: 40 min  Stress: No Stress Concern Present (08/28/2024)   [employer] of Occupational Health - Occupational Stress Questionnaire    Feeling of Stress: Not at all  Social Connections: Unknown (08/28/2024)   Social Connection and Isolation Panel    Frequency of Communication with Friends and Family: Once a week    Frequency of Social Gatherings with Friends and Family: Patient declined    Attends Religious Services: Patient declined    Database Administrator or Organizations: No    Attends Engineer, Structural: Not on file    Marital Status: Married  Catering Manager Violence: Not on file  Depression (PHQ2-9): Low Risk (09/01/2024)   Depression (PHQ2-9)    PHQ-2 Score: 0  Alcohol Screen: Low Risk (08/28/2024)   Alcohol Screen    Last Alcohol Screening Score (AUDIT): 1  Housing: Low Risk (08/28/2024)   Epic    Unable to Pay for Housing in the Last Year: No    Number of Times Moved in the Last Year: 0    Homeless in the Last Year: No  Utilities: Not on file  Health Literacy: Not on file      Review of Systems  All other systems reviewed and are  negative.      Objective:   Physical Exam Vitals reviewed.  Constitutional:      General: He is not in acute distress.    Appearance: He is well-developed. He is not diaphoretic.  HENT:     Head: Normocephalic and atraumatic.     Right Ear: Tympanic membrane, ear canal and external ear normal.     Left Ear: Tympanic membrane, ear canal and external ear normal.     Nose:  Nose normal.     Mouth/Throat:     Pharynx: No oropharyngeal exudate.  Eyes:     General: No scleral icterus.       Right eye: No discharge.        Left eye: No discharge.     Conjunctiva/sclera: Conjunctivae normal.     Pupils: Pupils are equal, round, and reactive to light.  Neck:     Thyroid : No thyromegaly.     Vascular: No JVD.     Trachea: No tracheal deviation.  Cardiovascular:     Rate and Rhythm: Normal rate and regular rhythm.     Heart sounds: Normal heart sounds. No murmur heard.    No friction rub. No gallop.  Pulmonary:     Effort: Pulmonary effort is normal. No respiratory distress.     Breath sounds: Normal breath sounds. No stridor. No wheezing or rales.  Abdominal:     General: Bowel sounds are normal. There is no distension.     Palpations: Abdomen is soft. There is no mass.     Tenderness: There is no abdominal tenderness. There is no guarding or rebound.  Musculoskeletal:     Cervical back: Normal range of motion and neck supple.  Lymphadenopathy:     Cervical: No cervical adenopathy.  Skin:    General: Skin is warm.     Coloration: Skin is not pale.     Findings: No erythema or rash.  Neurological:     Mental Status: He is alert and oriented to person, place, and time.     Cranial Nerves: No cranial nerve deficit.     Motor: No abnormal muscle tone.     Coordination: Coordination normal.     Deep Tendon Reflexes: Reflexes normal.  Psychiatric:        Behavior: Behavior normal.        Thought Content: Thought content normal.        Judgment: Judgment normal.   Patient has  large varicose veins particularly in his right leg along his medial right thigh and his medial anterior right shin.  These are asymptomatic and do not hurt him.  He has excellent peripheral pulses.        Assessment & Plan:  Allergy  to alpha-gal - Plan: Alpha-Gal Panel  Prostate cancer screening - Plan: PSA  Pure hypercholesterolemia  Coronary artery disease involving native coronary artery of native heart without angina pectoris - Plan: CBC with Differential/Platelet, Lipoprotein A (LPA)  Hypothyroidism, unspecified type - Plan: TSH, CBC with Differential/Platelet  General medical exam I am extremely proud of the patient for his weight loss.  His blood pressure is outstanding.  I will check a PSA to screen for prostate cancer.  Patient's next colonoscopy is due in 2034.  His last colonoscopy was 2024.  Check TSH, CBC, alpha gal panel, and lipoprotein a.  Recent LDL cholesterol was at goal at less than 55.  Titrate dose of levothyroxine  to achieve therapeutic TSH range.

## 2024-09-01 NOTE — Addendum Note (Signed)
 Addended by: ANGELENA RONAL BRADLEY K on: 09/01/2024 12:44 PM   Modules accepted: Orders

## 2024-09-02 ENCOUNTER — Other Ambulatory Visit: Payer: Self-pay

## 2024-09-02 ENCOUNTER — Ambulatory Visit: Payer: Self-pay | Admitting: Family Medicine

## 2024-09-02 MED ORDER — LEVOTHYROXINE SODIUM 150 MCG PO TABS: 150.0000 ug | ORAL_TABLET | Freq: Every day | ORAL | 3 refills | Status: AC

## 2024-09-05 ENCOUNTER — Telehealth: Payer: Self-pay | Admitting: Cardiology

## 2024-09-05 LAB — INTERPRETATION:

## 2024-09-05 LAB — ALPHA-GAL PANEL
Allergen, Mutton, f88: 42.2 kU/L — ABNORMAL HIGH
Allergen, Pork, f26: 40 kU/L — ABNORMAL HIGH
Beef: 67.9 kU/L — ABNORMAL HIGH
CLASS: 4
CLASS: 5
Class: 4
GALACTOSE-ALPHA-1,3-GALACTOSE IGE*: >100 kU/L — ABNORMAL HIGH

## 2024-09-05 LAB — CBC WITH DIFFERENTIAL/PLATELET
Absolute Lymphocytes: 1073 {cells}/uL (ref 850–3900)
Absolute Monocytes: 394 {cells}/uL (ref 200–950)
Basophils Absolute: 39 {cells}/uL (ref 0–200)
Basophils Relative: 1 %
Eosinophils Absolute: 101 {cells}/uL (ref 15–500)
Eosinophils Relative: 2.6 %
HCT: 45.2 % (ref 39.4–51.1)
Hemoglobin: 14.8 g/dL (ref 13.2–17.1)
MCH: 30.8 pg (ref 27.0–33.0)
MCHC: 32.7 g/dL (ref 31.6–35.4)
MCV: 94 fL (ref 81.4–101.7)
MPV: 9.7 fL (ref 7.5–12.5)
Monocytes Relative: 10.1 %
Neutro Abs: 2293 {cells}/uL (ref 1500–7800)
Neutrophils Relative %: 58.8 %
Platelets: 213 Thousand/uL (ref 140–400)
RBC: 4.81 Million/uL (ref 4.20–5.80)
RDW: 11.8 % (ref 11.0–15.0)
Total Lymphocyte: 27.5 %
WBC: 3.9 Thousand/uL (ref 3.8–10.8)

## 2024-09-05 LAB — TSH: TSH: 0.15 m[IU]/L — ABNORMAL LOW (ref 0.40–4.50)

## 2024-09-05 LAB — LIPOPROTEIN A (LPA): Lipoprotein (a): 213 nmol/L — ABNORMAL HIGH

## 2024-09-05 LAB — PSA: PSA: 1.18 ng/mL (ref <=4.00)

## 2024-09-05 NOTE — Telephone Encounter (Signed)
 Patient is needing a phone call about the diagnosis codes that were put in for his lab. The issue is with Lipo protein A test date 07-11-24. His spouse would like a call back to discuss and to get this resolved at cell phone [phone]. She said it is not a billing issue but a diagnosis issue.

## 2024-09-09 DIAGNOSIS — G4733 Obstructive sleep apnea (adult) (pediatric): Secondary | ICD-10-CM | POA: Diagnosis not present

## 2024-09-09 NOTE — Telephone Encounter (Signed)
 Pt was charged for Lipo A on 10/24 and  per spouse updated diagnosis code. Diagnosis code I 25.84 and elevated calcium  score. Made patient wife aware this will be forward to billing to correct diagnosis. Per patient spouse there is a form from [facility] that must be obtained to update diagnosis code. Made patient spouse aware that this will be forward to billing to see if form can be obtained from [facility]. Per spouse [facility] may have resulted multiple times and not sure why they were charged for 10/24 [facility] bill. Patient has multiple LpA and wife would like 10/24 updated with code and resubmitted for insurance to pay.Made spouse aware once this has been resolved someone will reach out to them.

## 2024-09-23 ENCOUNTER — Encounter: Payer: Self-pay | Admitting: Cardiology

## 2025-09-03 ENCOUNTER — Encounter: Admitting: Family Medicine
